# Patient Record
Sex: Female | Born: 1985 | Race: Black or African American | Hispanic: No | Marital: Single | Smoking: Former smoker
Health system: Southern US, Community
[De-identification: ages and names within clinical notes are randomized; demographics above are authoritative.]

## PROBLEM LIST (undated history)

## (undated) DIAGNOSIS — R51 Headache: Secondary | ICD-10-CM

## (undated) DIAGNOSIS — L309 Dermatitis, unspecified: Secondary | ICD-10-CM

## (undated) DIAGNOSIS — D649 Anemia, unspecified: Secondary | ICD-10-CM

## (undated) DIAGNOSIS — I10 Essential (primary) hypertension: Secondary | ICD-10-CM

## (undated) HISTORY — PX: NO PAST SURGERIES: SHX2092

## (undated) HISTORY — DX: Dermatitis, unspecified: L30.9

## (undated) HISTORY — PX: TOOTH EXTRACTION: SUR596

---

## 2009-05-11 ENCOUNTER — Emergency Department (HOSPITAL_COMMUNITY): Admission: EM | Admit: 2009-05-11 | Discharge: 2009-05-11 | Payer: Self-pay | Admitting: Family Medicine

## 2009-05-27 ENCOUNTER — Emergency Department (HOSPITAL_COMMUNITY): Admission: EM | Admit: 2009-05-27 | Discharge: 2009-05-27 | Payer: Self-pay | Admitting: Emergency Medicine

## 2009-08-09 ENCOUNTER — Ambulatory Visit: Payer: Self-pay | Admitting: Obstetrics

## 2009-09-01 ENCOUNTER — Encounter: Payer: Self-pay | Admitting: Obstetrics and Gynecology

## 2009-09-19 ENCOUNTER — Encounter: Payer: Self-pay | Admitting: Obstetrics and Gynecology

## 2009-10-12 ENCOUNTER — Ambulatory Visit: Payer: Self-pay | Admitting: Obstetrics and Gynecology

## 2009-10-24 ENCOUNTER — Encounter: Payer: Self-pay | Admitting: Maternal & Fetal Medicine

## 2009-11-02 ENCOUNTER — Encounter: Payer: Self-pay | Admitting: Family

## 2009-11-02 ENCOUNTER — Ambulatory Visit: Payer: Self-pay | Admitting: Obstetrics and Gynecology

## 2009-11-02 LAB — CONVERTED CEMR LAB
ALT: <8 units/L (ref 0–35)
AST: 16 units/L (ref 0–37)
Albumin: 3.3 g/dL — ABNORMAL LOW (ref 3.5–5.2)
Alkaline Phosphatase: 109 units/L (ref 39–117)
Antibody Screen: NEGATIVE
BUN: 9 mg/dL (ref 6–23)
CO2: 20 meq/L (ref 19–32)
Calcium: 8.5 mg/dL (ref 8.4–10.5)
Chloride: 105 meq/L (ref 96–112)
Creatinine, Ser: 0.61 mg/dL (ref 0.40–1.20)
Glucose, Bld: 48 mg/dL — ABNORMAL LOW (ref 70–99)
HCT: 28.7 % — ABNORMAL LOW (ref 36.0–46.0)
Hemoglobin: 9.8 g/dL — ABNORMAL LOW (ref 12.0–15.0)
MCHC: 34.1 g/dL (ref 30.0–36.0)
MCV: 77.2 fL — ABNORMAL LOW (ref 78.0–100.0)
Platelets: 153 10*3/uL (ref 150–400)
Potassium: 3.7 meq/L (ref 3.5–5.3)
RBC: 3.72 M/uL — ABNORMAL LOW (ref 3.87–5.11)
RDW: 14.6 % (ref 11.5–15.5)
Sodium: 140 meq/L (ref 135–145)
Total Bilirubin: 0.9 mg/dL (ref 0.3–1.2)
Total Protein: 6.3 g/dL (ref 6.0–8.3)
Uric Acid, Serum: 3.9 mg/dL (ref 2.4–7.0)
WBC: 7.9 10*3/uL (ref 4.0–10.5)

## 2009-11-04 ENCOUNTER — Encounter: Payer: Self-pay | Admitting: Obstetrics & Gynecology

## 2009-11-04 LAB — CONVERTED CEMR LAB
Collection Interval-CRCL: 24 hr
Creatinine 24 HR UR: 705 mg/24hr (ref 700–1800)
Creatinine Clearance: 80 mL/min (ref 75–115)
Creatinine, Urine: 88.1 mg/dL
Protein, Ur: 40 mg/24hr — ABNORMAL LOW (ref 50–100)

## 2009-11-16 ENCOUNTER — Ambulatory Visit: Payer: Self-pay | Admitting: Obstetrics and Gynecology

## 2009-11-16 LAB — CONVERTED CEMR LAB
Ferritin: 9 ng/mL — ABNORMAL LOW (ref 10–291)
Folate: 18.6 ng/mL
Hgb A2 Quant: 3.2 % (ref 2.2–3.2)
Hgb A: 60.9 % — ABNORMAL LOW (ref 96.8–97.8)
Hgb F Quant: 0.3 % (ref 0.0–2.0)
Hgb S Quant: 0 % (ref 0.0–0.0)
Vitamin B-12: 373 pg/mL (ref 211–911)

## 2009-11-24 ENCOUNTER — Encounter: Payer: Self-pay | Admitting: Maternal and Fetal Medicine

## 2009-12-01 ENCOUNTER — Encounter: Payer: Self-pay | Admitting: Obstetrics & Gynecology

## 2009-12-05 ENCOUNTER — Encounter: Payer: Self-pay | Admitting: Obstetrics and Gynecology

## 2009-12-05 ENCOUNTER — Ambulatory Visit: Payer: Self-pay | Admitting: Obstetrics & Gynecology

## 2009-12-05 LAB — CONVERTED CEMR LAB
Chlamydia, DNA Probe: NEGATIVE
GC Probe Amp, Genital: NEGATIVE

## 2009-12-08 ENCOUNTER — Ambulatory Visit: Payer: Self-pay | Admitting: Obstetrics and Gynecology

## 2009-12-08 ENCOUNTER — Ambulatory Visit (HOSPITAL_COMMUNITY): Admission: RE | Admit: 2009-12-08 | Discharge: 2009-12-08 | Payer: Self-pay | Admitting: Family Medicine

## 2009-12-12 ENCOUNTER — Ambulatory Visit: Payer: Self-pay | Admitting: Obstetrics & Gynecology

## 2009-12-15 ENCOUNTER — Ambulatory Visit: Payer: Self-pay | Admitting: Obstetrics & Gynecology

## 2009-12-15 ENCOUNTER — Encounter: Payer: Self-pay | Admitting: Obstetrics and Gynecology

## 2009-12-19 ENCOUNTER — Inpatient Hospital Stay (HOSPITAL_COMMUNITY): Admission: AD | Admit: 2009-12-19 | Discharge: 2009-12-21 | Payer: Self-pay | Admitting: Obstetrics & Gynecology

## 2009-12-19 ENCOUNTER — Ambulatory Visit: Payer: Self-pay | Admitting: Obstetrics and Gynecology

## 2009-12-19 ENCOUNTER — Encounter: Payer: Self-pay | Admitting: Obstetrics & Gynecology

## 2010-01-30 ENCOUNTER — Ambulatory Visit: Payer: Self-pay | Admitting: Obstetrics & Gynecology

## 2010-01-30 ENCOUNTER — Encounter: Payer: Self-pay | Admitting: Obstetrics and Gynecology

## 2010-01-30 LAB — CONVERTED CEMR LAB
Chlamydia, Swab/Urine, PCR: NEGATIVE
GC Probe Amp, Urine: NEGATIVE

## 2010-02-14 ENCOUNTER — Ambulatory Visit: Payer: Self-pay | Admitting: Family Medicine

## 2010-02-14 ENCOUNTER — Encounter: Payer: Self-pay | Admitting: Family Medicine

## 2010-02-14 DIAGNOSIS — I10 Essential (primary) hypertension: Secondary | ICD-10-CM | POA: Insufficient documentation

## 2010-02-14 LAB — CONVERTED CEMR LAB
Bilirubin Urine: NEGATIVE
Glucose, Urine, Semiquant: NEGATIVE
Nitrite: NEGATIVE
Protein, U semiquant: 30
Specific Gravity, Urine: 1.025
Urobilinogen, UA: 1
pH: 6

## 2010-02-16 ENCOUNTER — Encounter: Payer: Self-pay | Admitting: Family Medicine

## 2010-02-16 DIAGNOSIS — E079 Disorder of thyroid, unspecified: Secondary | ICD-10-CM | POA: Insufficient documentation

## 2010-02-16 LAB — CONVERTED CEMR LAB
ALT: 17 units/L (ref 0–35)
AST: 20 units/L (ref 0–37)
Albumin: 4.3 g/dL (ref 3.5–5.2)
Alkaline Phosphatase: 81 units/L (ref 39–117)
BUN: 11 mg/dL (ref 6–23)
CO2: 22 meq/L (ref 19–32)
Calcium: 9.1 mg/dL (ref 8.4–10.5)
Chloride: 109 meq/L (ref 96–112)
Cholesterol: 162 mg/dL (ref 0–200)
Creatinine, Ser: 0.96 mg/dL (ref 0.40–1.20)
Free T4: 1.06 ng/dL (ref 0.80–1.80)
Glucose, Bld: 96 mg/dL (ref 70–99)
HCT: 37.9 % (ref 36.0–46.0)
HDL: 53 mg/dL (ref >39)
Hemoglobin: 13.1 g/dL (ref 12.0–15.0)
LDL Cholesterol: 99 mg/dL (ref 0–99)
MCHC: 34.6 g/dL (ref 30.0–36.0)
MCV: 75.2 fL — ABNORMAL LOW (ref 78.0–100.0)
Platelets: 245 10*3/uL (ref 150–400)
Potassium: 4 meq/L (ref 3.5–5.3)
RBC: 5.04 M/uL (ref 3.87–5.11)
RDW: 16.8 % — ABNORMAL HIGH (ref 11.5–15.5)
Sodium: 140 meq/L (ref 135–145)
T3, Free: 3 pg/mL (ref 2.3–4.2)
TSH: 0.265 microintl units/mL — ABNORMAL LOW (ref 0.350–4.500)
Total Bilirubin: 1.7 mg/dL — ABNORMAL HIGH (ref 0.3–1.2)
Total CHOL/HDL Ratio: 3.1
Total Protein: 7.3 g/dL (ref 6.0–8.3)
Triglycerides: 48 mg/dL (ref <150)
VLDL: 10 mg/dL (ref 0–40)
WBC: 4.8 10*3/uL (ref 4.0–10.5)

## 2010-05-12 ENCOUNTER — Emergency Department (HOSPITAL_COMMUNITY)
Admission: EM | Admit: 2010-05-12 | Discharge: 2010-05-12 | Payer: Self-pay | Source: Home / Self Care | Admitting: Emergency Medicine

## 2010-05-22 LAB — POCT RAPID STREP A (OFFICE): Streptococcus, Group A Screen (Direct): NEGATIVE

## 2010-06-06 NOTE — Assessment & Plan Note (Signed)
Summary: NP/Ref from [facility]   Vital Signs:  Patient profile:   25 year old female Height:      63.5 inches Weight:      134.2 pounds BMI:     23.48 Temp:     98.9 degrees F oral Pulse rate:   103 / minute BP sitting:   131 / 81  (left arm) Cuff size:   regular  Vitals Entered By: Garen Grams LPN (February 14, 2010 10:16 AM) CC: New Patient (f/u HTN) Is Patient Diabetic? No Pain Assessment Patient in pain? no        Primary Care Provider:  Bobby Rumpf  MD  CC:  New Patient (f/u HTN).  History of Present Illness: 1) HTN: New patient referred from [facility] for control of hypertension. Does not check BP at home. HTN was discovered while she was in the [employer] - patient was started in HCTZ - she cannot remember if this worked or not. BP elevated with fast food, salty foods. No history of pre-ecclampsia during pregnancies (G2P2). On methyldopa as below currently, with plan to start Norvasc 10 mg when methyldopa runs out. No exercise currently. Does not monitor salt intake. Two servings fruits / vegetables a day.   Not currently breastfeeding  ROS: Denies chest pain, headache, LE edema, dyspnea, focal neurological signs, vision change, abdominal pain, hyperthyroid sympotms, urinary changes, heart palpitations  Med Rec: Methydopa 500 mg by mouth two times a day   Habits & Providers  Alcohol-Tobacco-Diet     Tobacco Status: never  Allergies (verified): No Known Drug Allergies  Past History:  Past Medical History: HTN Seasonal allergies   G2P2  Past Surgical History: None  Family History: Dad - DM2, HTN Mom - thyroid disease   Social History: Two children, fiance.  no alcohol or tobacco or drug use. In [employer]. Smoking Status:  never  Physical Exam  General:  vitals reviewed, normotensive pleasant, NAD  Eyes:  normal fundi  Mouth:  moist membranes  Neck:  no thyromegaly or carotid bruit or JVD  Lungs:  CTAB w/o crackles  Heart:  RRR, no murmurs Abdomen:   soft non tender, non distended, no bruits  Pulses:  2+ radials  Extremities:  no edema   Impression & Recommendations:  Problem # 1:  HYPERTENSION (ICD-401.9) Assessment New Will initiate work up with labs as below. Will start with HCTZ once patient has completed methyldopa. Follow up in one month. DASH diet and exercise discussed. At goal today 140/90.  Her updated medication list for this problem includes:    Hydrochlorothiazide 25 Mg Tabs (Hydrochlorothiazide) ..... One tab by mouth qday  Orders: Comp Met-[facility] [phone]) Lipid-[facility] ([id number]) CBC-[facility] (41660) TSH-[facility] ([id number]) Urinalysis-[facility] (00000)  Complete Medication List: 1)  Hydrochlorothiazide 25 Mg Tabs (Hydrochlorothiazide) .... One tab by mouth qday  Patient Instructions: 1)  Follow up with me in one month 2)  It was great to meet you today! 3)  Avoid adding salt you your food (especially when you cook) 4)  I will start you on hydrochlorothiazide to take after you are done with the methyldopa. Prescriptions: HYDROCHLOROTHIAZIDE 25 MG TABS (HYDROCHLOROTHIAZIDE) one tab by mouth qday  #30 x 0   Entered and Authorized by:   Bobby Rumpf  MD   Signed by:   Bobby Rumpf  MD on 02/14/2010   Method used:   Electronically to        [facility]  [phone]* (retail)       [phone] [address]  [address]       [place], [place]  [address]       Ph: [phone] or [phone]       Fax: [phone]   RxID:   [id number]   Laboratory Results   Urine Tests  Date/Time Received: February 14, 2010 10:53 AM  Date/Time Reported: February 14, 2010 11:50 AM   Routine Urinalysis   Color: red Appearance: Cloudy Glucose: negative   (Normal Range: Negative) Bilirubin: negative   (Normal Range: Negative) Ketone: trace (5)   (Normal Range: Negative) Spec. Gravity: 1.025   (Normal Range: 1.003-1.035) Blood: large   (Normal Range: Negative) pH: 6.0   (Normal Range: 5.0-8.0) Protein: 30   (Normal  Range: Negative) Urobilinogen: 1.0   (Normal Range: 0-1) Nitrite: negative   (Normal Range: Negative) Leukocyte Esterace: trace   (Normal Range: Negative)  Urine Microscopic WBC/HPF: 10-20 RBC/HPF: loaded Bacteria/HPF: 3+ cocci Epithelial/HPF: 10-20 with several clue cells    Comments: ...............test performed by......Marland KitchenBonnie A. [place], MLS (ASCP)cm

## 2010-07-20 LAB — POCT URINALYSIS DIPSTICK
Bilirubin Urine: NEGATIVE
Glucose, UA: NEGATIVE mg/dL
Ketones, ur: NEGATIVE mg/dL
Nitrite: NEGATIVE
Protein, ur: NEGATIVE mg/dL
Specific Gravity, Urine: 1.02 (ref 1.005–1.030)
Urobilinogen, UA: 0.2 mg/dL (ref 0.0–1.0)
pH: 5.5 (ref 5.0–8.0)

## 2010-07-21 LAB — CBC
HCT: 27 % — ABNORMAL LOW (ref 36.0–46.0)
HCT: 28.8 % — ABNORMAL LOW (ref 36.0–46.0)
Hemoglobin: 9.3 g/dL — ABNORMAL LOW (ref 12.0–15.0)
Hemoglobin: 9.6 g/dL — ABNORMAL LOW (ref 12.0–15.0)
MCH: 26.7 pg (ref 26.0–34.0)
MCH: 27.4 pg (ref 26.0–34.0)
MCHC: 33.4 g/dL (ref 30.0–36.0)
MCHC: 34.3 g/dL (ref 30.0–36.0)
MCV: 79.9 fL (ref 78.0–100.0)
MCV: 80 fL (ref 78.0–100.0)
Platelets: 127 10*3/uL — ABNORMAL LOW (ref 150–400)
Platelets: 147 10*3/uL — ABNORMAL LOW (ref 150–400)
RBC: 3.37 MIL/uL — ABNORMAL LOW (ref 3.87–5.11)
RBC: 3.61 MIL/uL — ABNORMAL LOW (ref 3.87–5.11)
RDW: 15.4 % (ref 11.5–15.5)
RDW: 15.8 % — ABNORMAL HIGH (ref 11.5–15.5)
WBC: 11.5 10*3/uL — ABNORMAL HIGH (ref 4.0–10.5)
WBC: 5.9 10*3/uL (ref 4.0–10.5)

## 2010-07-21 LAB — RPR: RPR Ser Ql: NONREACTIVE

## 2010-07-21 LAB — POCT URINALYSIS DIPSTICK
Bilirubin Urine: NEGATIVE
Bilirubin Urine: NEGATIVE
Glucose, UA: NEGATIVE mg/dL
Glucose, UA: NEGATIVE mg/dL
Hgb urine dipstick: NEGATIVE
Hgb urine dipstick: NEGATIVE
Ketones, ur: NEGATIVE mg/dL
Ketones, ur: NEGATIVE mg/dL
Nitrite: NEGATIVE
Nitrite: NEGATIVE
Protein, ur: NEGATIVE mg/dL
Protein, ur: NEGATIVE mg/dL
Specific Gravity, Urine: 1.015 (ref 1.005–1.030)
Specific Gravity, Urine: 1.025 (ref 1.005–1.030)
Urobilinogen, UA: 0.2 mg/dL (ref 0.0–1.0)
Urobilinogen, UA: 1 mg/dL (ref 0.0–1.0)
pH: 6 (ref 5.0–8.0)
pH: 6.5 (ref 5.0–8.0)

## 2010-07-21 LAB — RH IMMUNE GLOB WKUP(>/=20WKS)(NOT WOMEN'S HOSP): Fetal Screen: NEGATIVE

## 2010-07-21 LAB — GLUCOSE, CAPILLARY: Glucose-Capillary: 92 mg/dL (ref 70–99)

## 2010-07-21 LAB — ABO/RH: ABO/RH(D): A NEG

## 2010-07-21 LAB — PLATELET COUNT: Platelets: 163 10*3/uL (ref 150–400)

## 2010-07-23 LAB — POCT URINALYSIS DIP (DEVICE)
Bilirubin Urine: NEGATIVE
Glucose, UA: NEGATIVE mg/dL
Glucose, UA: NEGATIVE mg/dL
Glucose, UA: NEGATIVE mg/dL
Hgb urine dipstick: NEGATIVE
Hgb urine dipstick: NEGATIVE
Hgb urine dipstick: NEGATIVE
Ketones, ur: 15 mg/dL — AB
Ketones, ur: 80 mg/dL — AB
Ketones, ur: NEGATIVE mg/dL
Nitrite: NEGATIVE
Nitrite: NEGATIVE
Nitrite: NEGATIVE
Protein, ur: 30 mg/dL — AB
Protein, ur: NEGATIVE mg/dL
Protein, ur: 30 mg/dL — AB
Specific Gravity, Urine: 1.025 (ref 1.005–1.030)
Specific Gravity, Urine: 1.025 (ref 1.005–1.030)
Specific Gravity, Urine: 1.025 (ref 1.005–1.030)
Urobilinogen, UA: 1 mg/dL (ref 0.0–1.0)
Urobilinogen, UA: 0.2 mg/dL (ref 0.0–1.0)
Urobilinogen, UA: 1 mg/dL (ref 0.0–1.0)
pH: 6 (ref 5.0–8.0)
pH: 6 (ref 5.0–8.0)
pH: 7 (ref 5.0–8.0)

## 2010-07-23 LAB — URINE CULTURE

## 2010-07-23 LAB — POCT PREGNANCY, URINE: Preg Test, Ur: POSITIVE

## 2010-07-24 LAB — URINE CULTURE: Colony Count: 2000

## 2010-07-24 LAB — POCT URINALYSIS DIP (DEVICE)
Bilirubin Urine: NEGATIVE
Bilirubin Urine: NEGATIVE
Glucose, UA: NEGATIVE mg/dL
Glucose, UA: NEGATIVE mg/dL
Hgb urine dipstick: NEGATIVE
Ketones, ur: NEGATIVE mg/dL
Ketones, ur: NEGATIVE mg/dL
Nitrite: NEGATIVE
Nitrite: NEGATIVE
Protein, ur: NEGATIVE mg/dL
Specific Gravity, Urine: <=1.005 (ref 1.005–1.030)
Urobilinogen, UA: 0.2 mg/dL (ref 0.0–1.0)
pH: 6 (ref 5.0–8.0)
pH: 7 (ref 5.0–8.0)

## 2010-07-24 LAB — WET PREP, GENITAL
Trich, Wet Prep: NONE SEEN
Yeast Wet Prep HPF POC: NONE SEEN

## 2010-07-24 LAB — POCT PREGNANCY, URINE: Preg Test, Ur: POSITIVE

## 2010-07-24 LAB — GC/CHLAMYDIA PROBE AMP, GENITAL
Chlamydia, DNA Probe: NEGATIVE
GC Probe Amp, Genital: NEGATIVE

## 2010-09-19 ENCOUNTER — Inpatient Hospital Stay (INDEPENDENT_AMBULATORY_CARE_PROVIDER_SITE_OTHER)
Admission: RE | Admit: 2010-09-19 | Discharge: 2010-09-19 | Disposition: A | Discharge status: Home / Self Care | Payer: Self-pay | Source: Ambulatory Visit | Attending: Family Medicine | Admitting: Family Medicine

## 2010-09-19 DIAGNOSIS — L259 Unspecified contact dermatitis, unspecified cause: Secondary | ICD-10-CM

## 2010-09-19 DIAGNOSIS — L989 Disorder of the skin and subcutaneous tissue, unspecified: Secondary | ICD-10-CM

## 2010-10-04 ENCOUNTER — Inpatient Hospital Stay (INDEPENDENT_AMBULATORY_CARE_PROVIDER_SITE_OTHER)
Admission: RE | Admit: 2010-10-04 | Discharge: 2010-10-04 | Disposition: A | Discharge status: Home / Self Care | Payer: Self-pay | Source: Ambulatory Visit | Attending: Emergency Medicine | Admitting: Emergency Medicine

## 2010-10-04 DIAGNOSIS — J069 Acute upper respiratory infection, unspecified: Secondary | ICD-10-CM

## 2010-10-04 DIAGNOSIS — J029 Acute pharyngitis, unspecified: Secondary | ICD-10-CM

## 2010-10-27 IMAGING — US US OB DETAIL+14 WK - NRPT [facility]
1 series · 14 of 28 positions shown · non-contrast
Comparison: none

[Series 1: us ob detail+14 wk - nrpt [facility] · 14 of 79 slices shown]
[im 3/79]
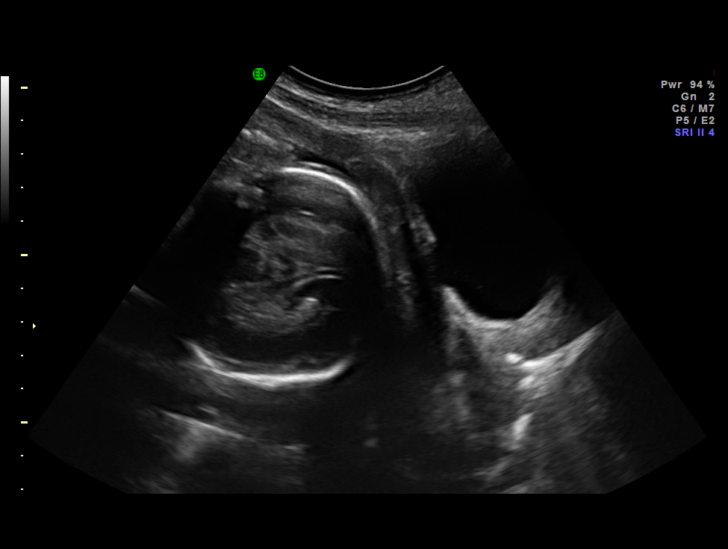
[im 9/79]
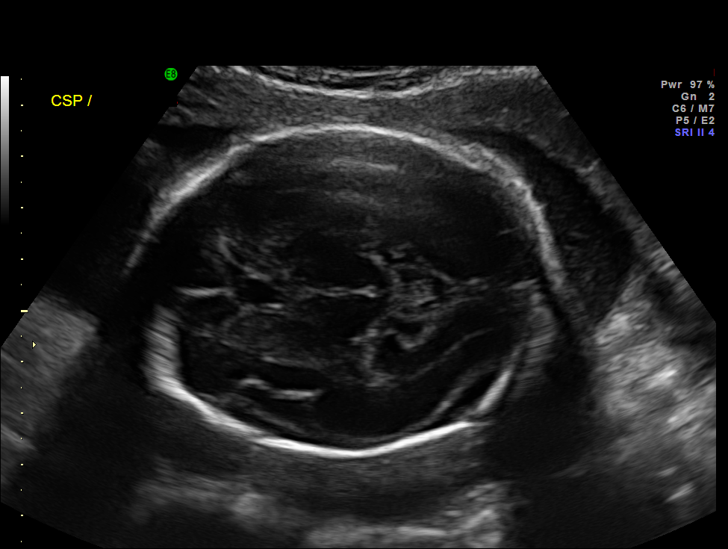
[im 15/79]
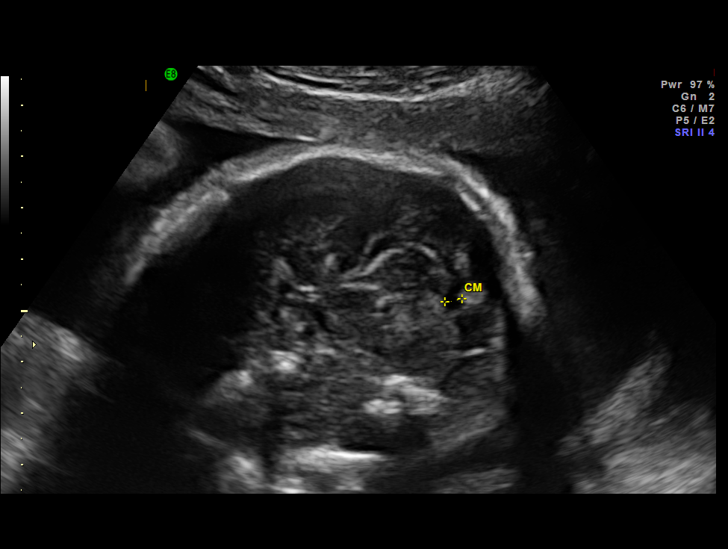
[im 21/79]
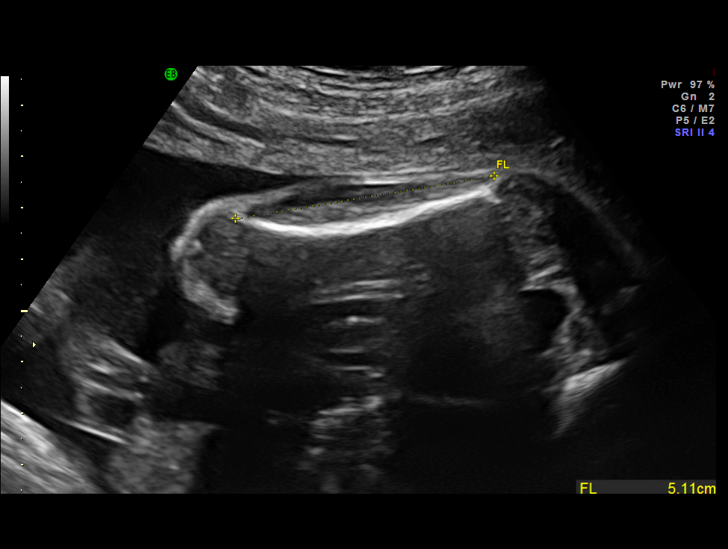
[im 27/79]
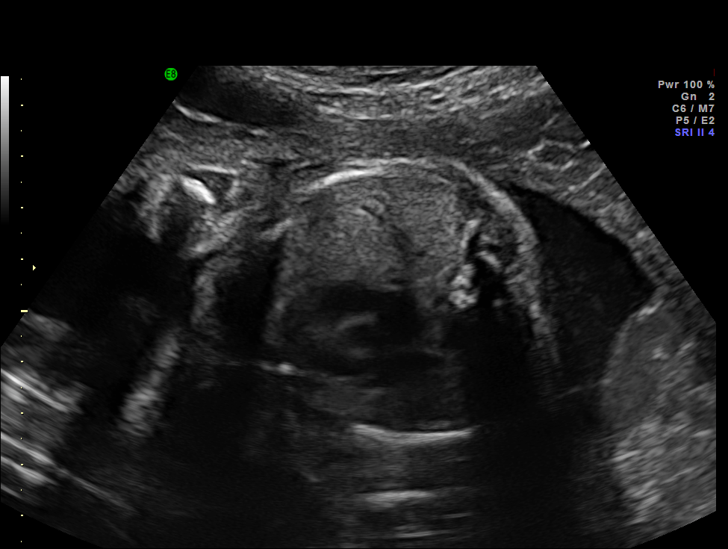
[im 32/79]
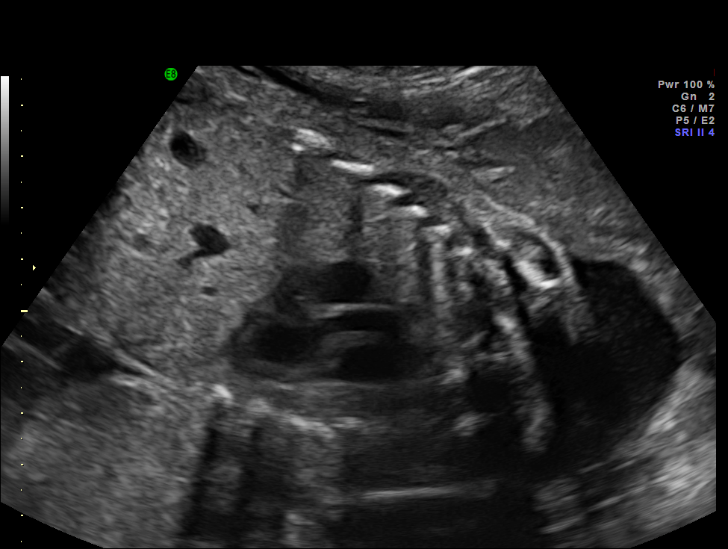
[im 38/79]
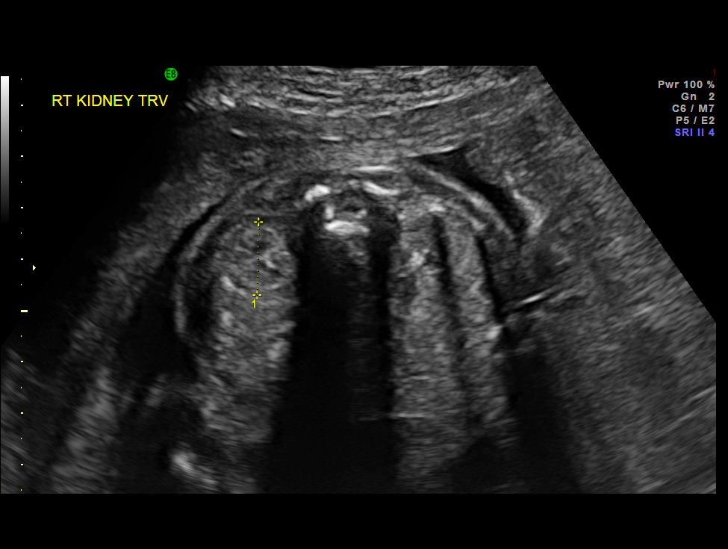
[im 44/79]
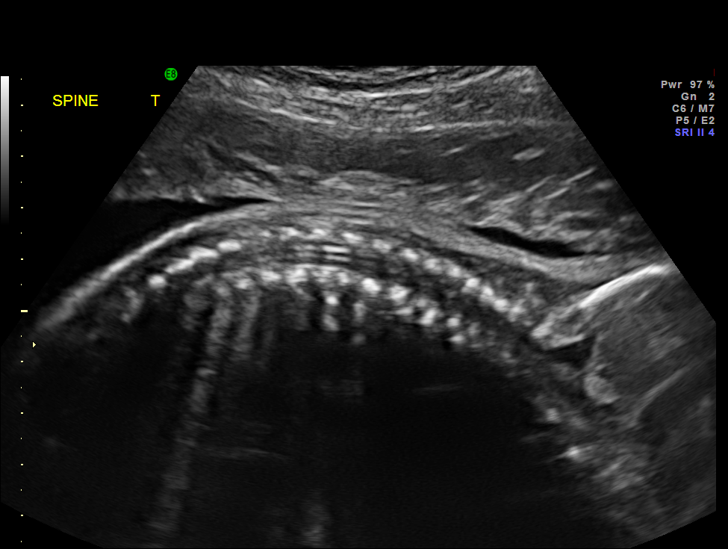
[im 50/79]
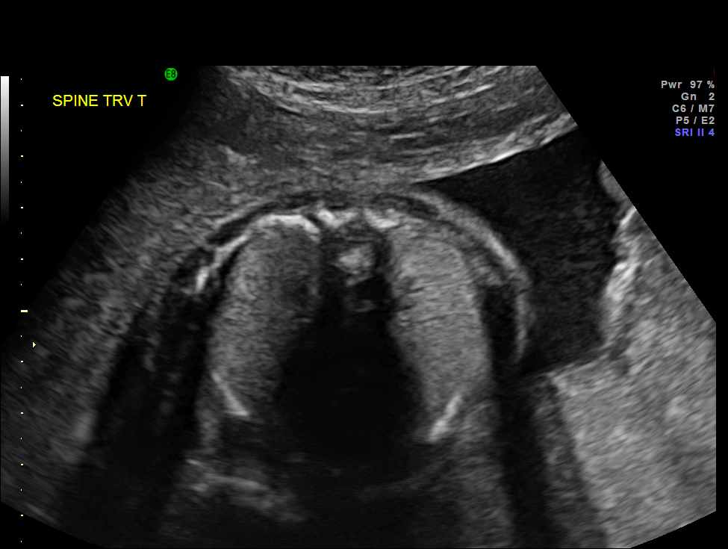
[im 55/79]
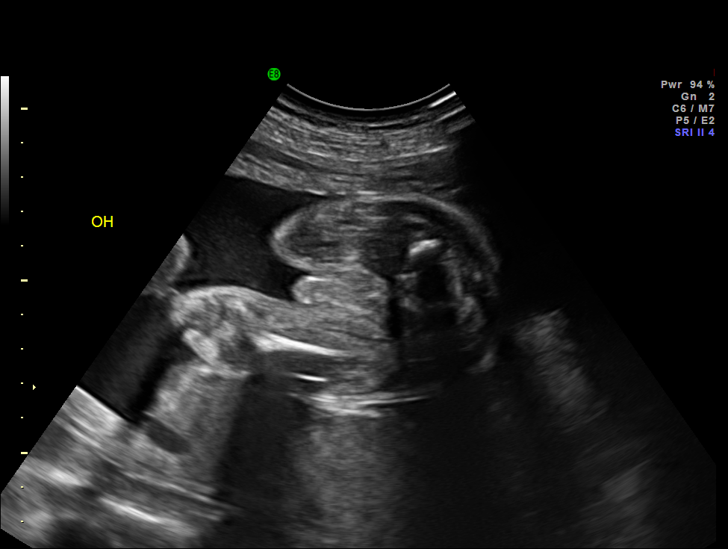
[im 61/79]
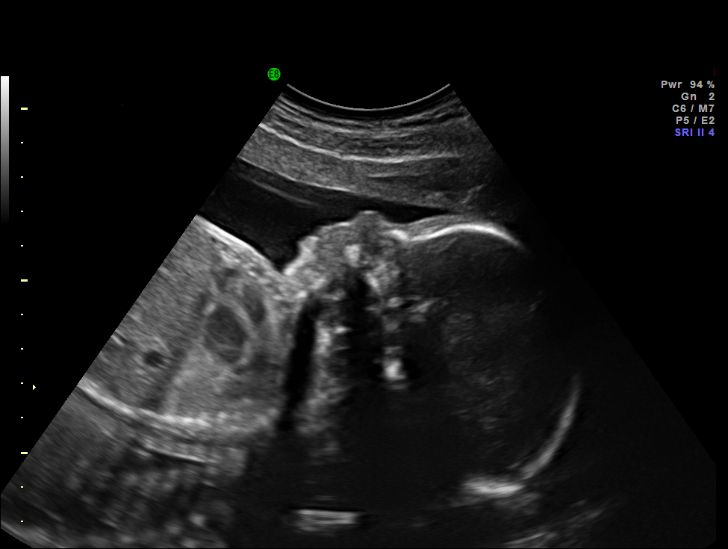
[im 67/79]
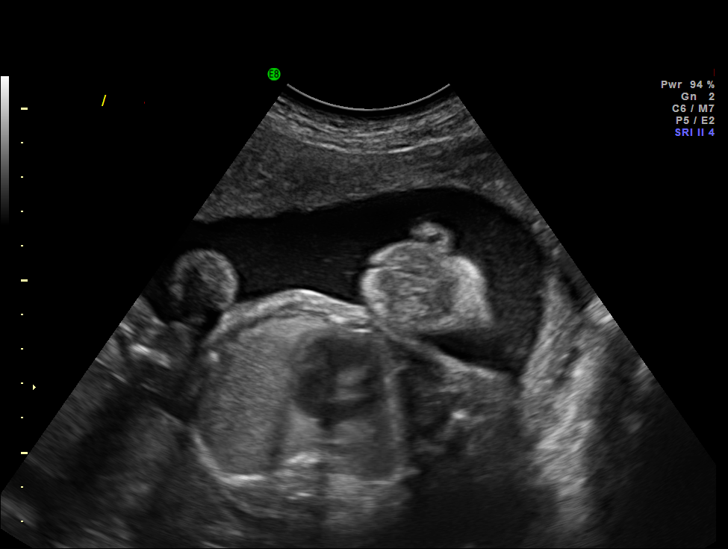
[im 73/79]
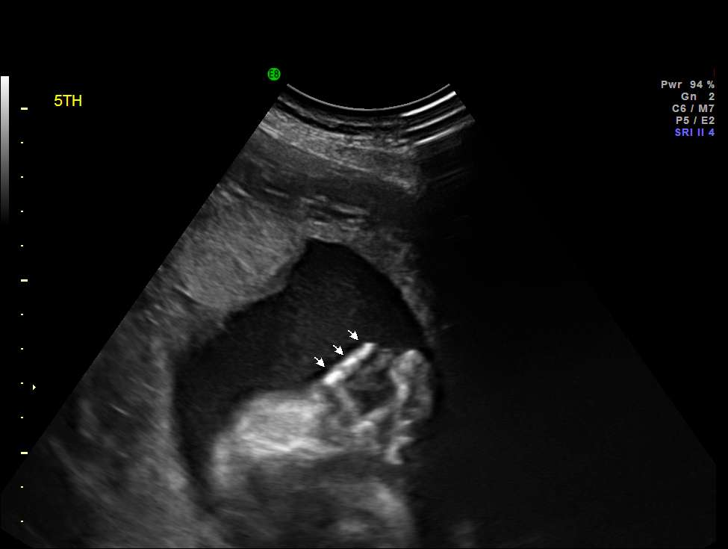
[im 79/79]
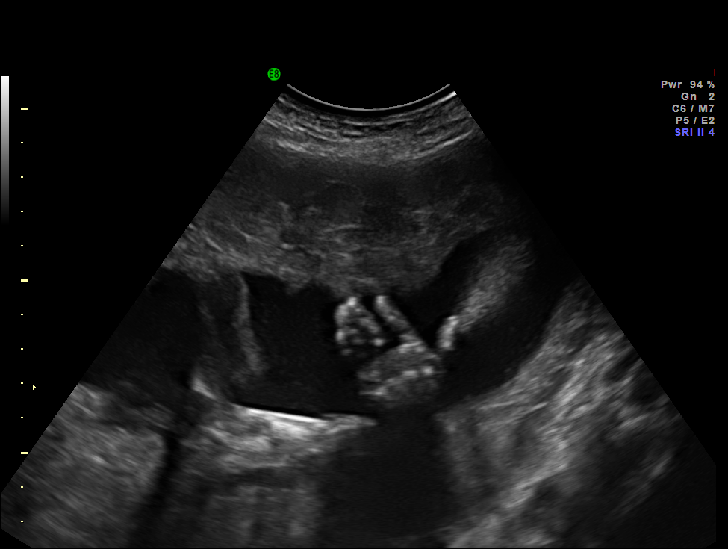

[14 of 28 positions shown; findings below may reference images not displayed]

IMAGES IMPORTED FROM THE SYNGO WORKFLOW SYSTEM
NO DICTATION FOR STUDY

## 2010-12-01 IMAGING — US US OB FOLLOW-UP - NRPT [facility]
1 series · 14 of 28 positions shown · non-contrast
Comparison: none

[Series 1: us ob follow-up - nrpt [facility] · 14 of 38 slices shown]
[im 2/38]
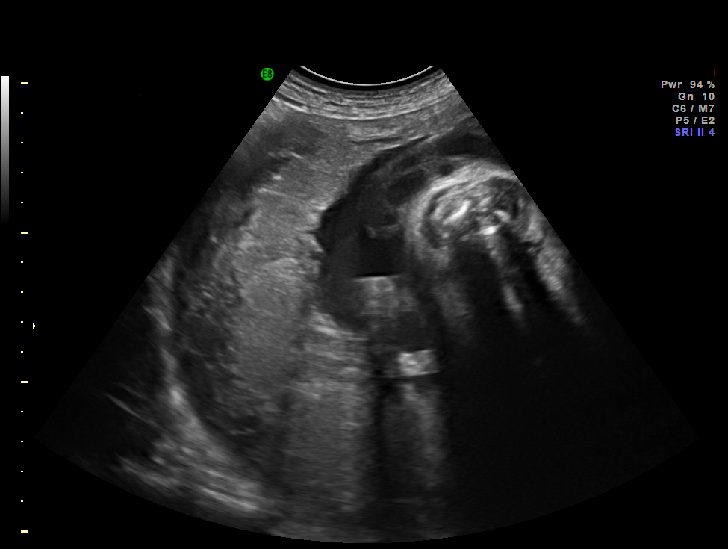
[im 5/38]
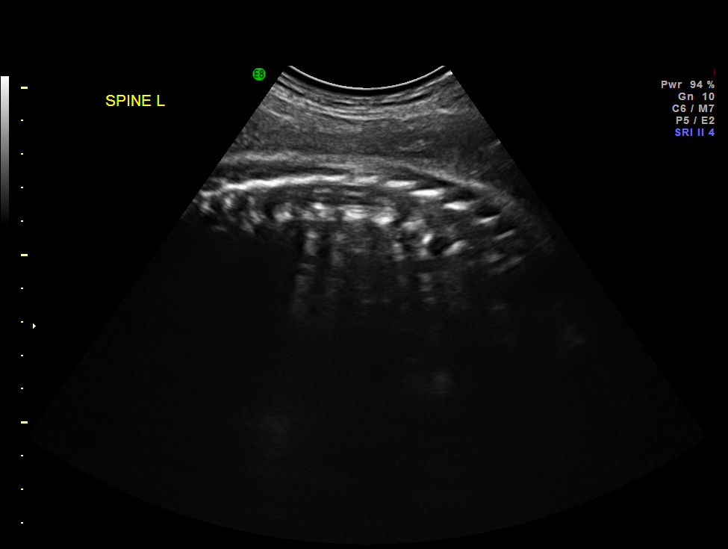
[im 7/38]
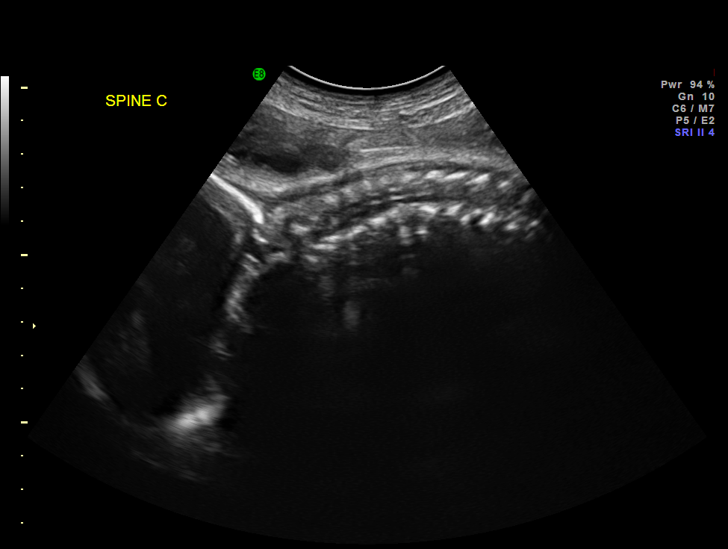
[im 10/38]
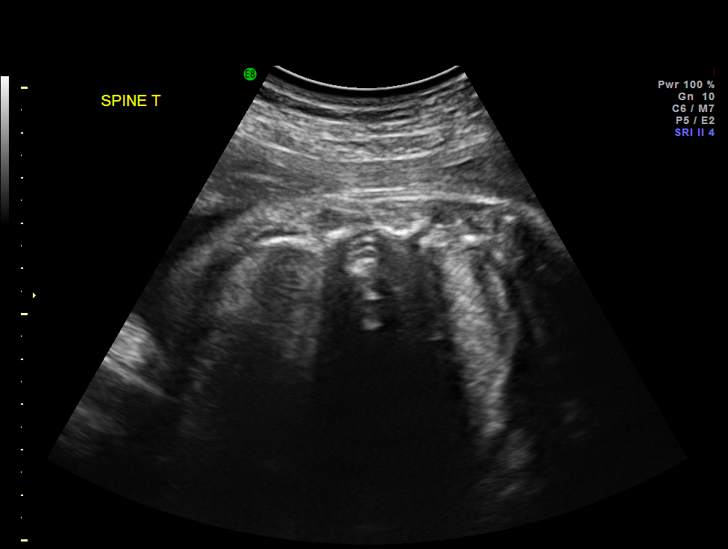
[im 13/38]
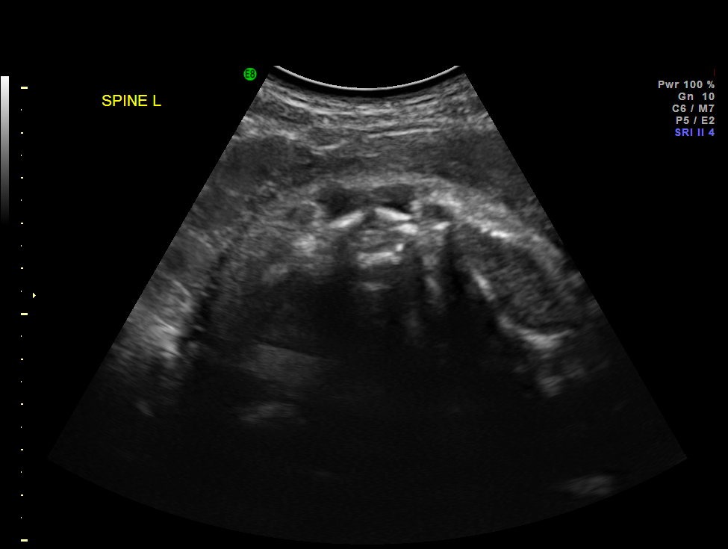
[im 16/38]
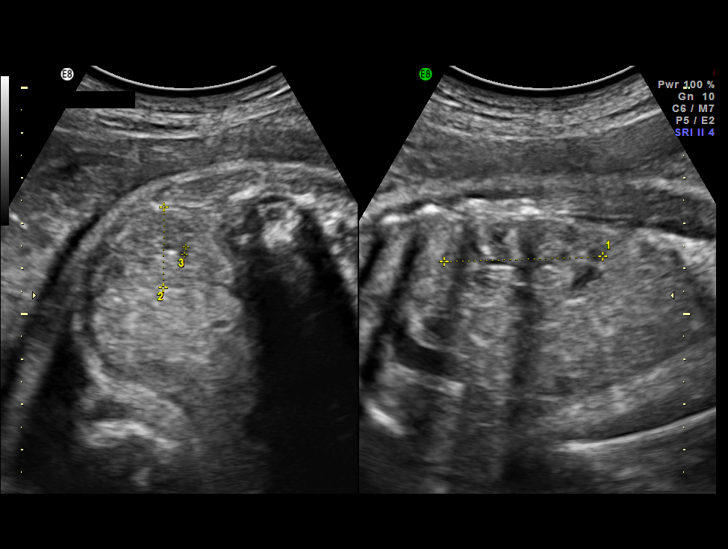
[im 18/38]
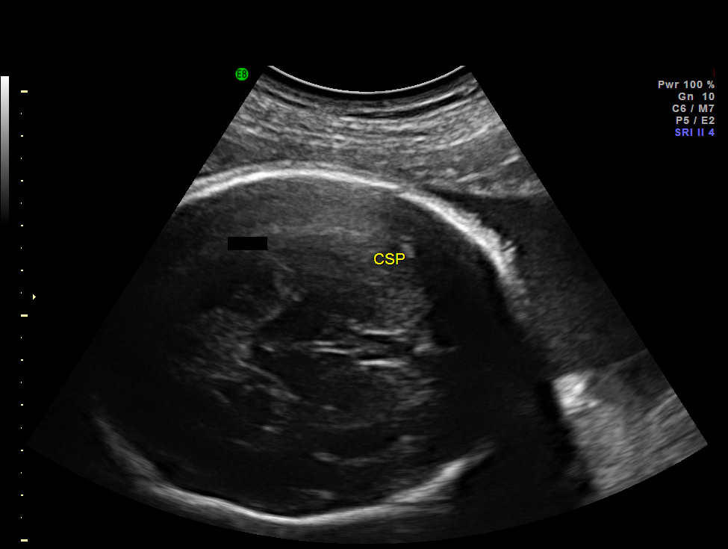
[im 21/38]
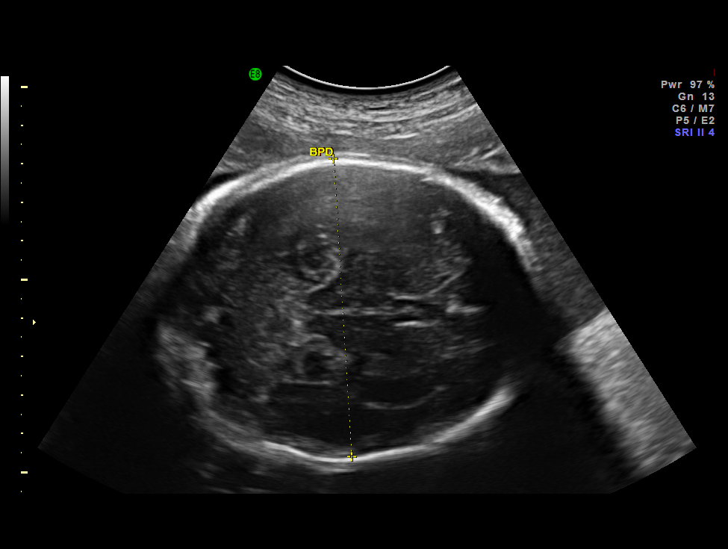
[im 24/38]
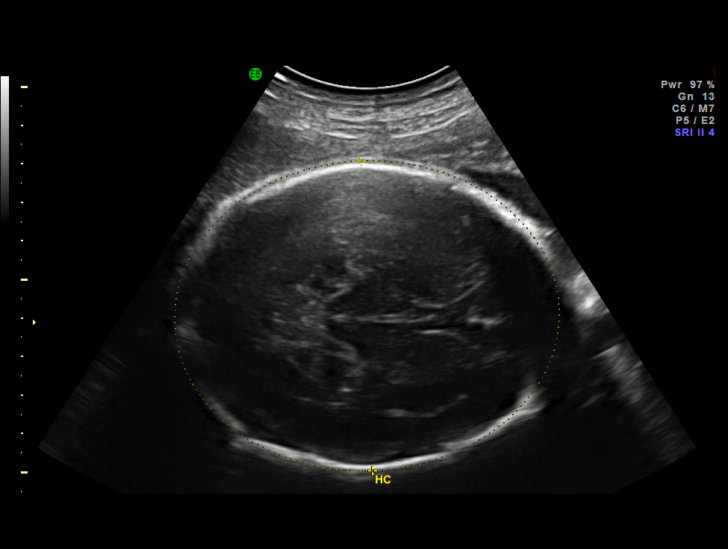
[im 27/38]
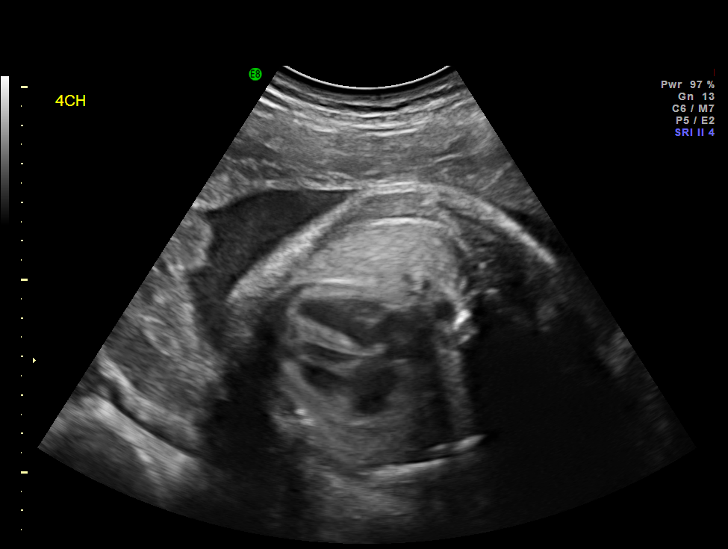
[im 29/38]
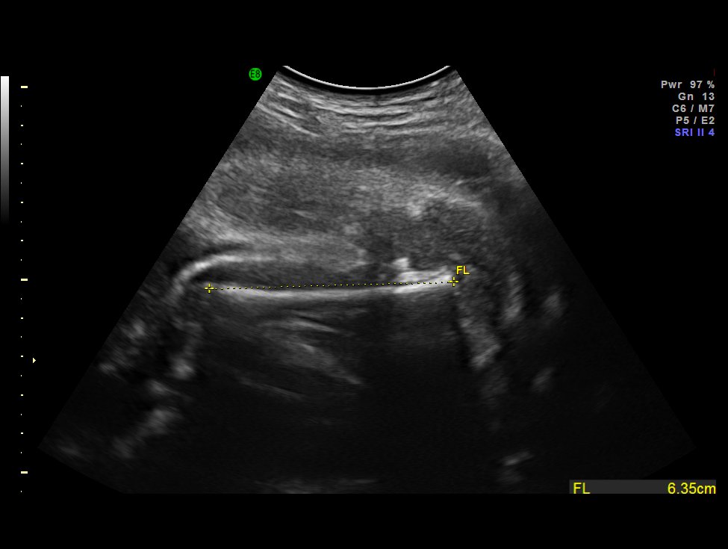
[im 32/38]
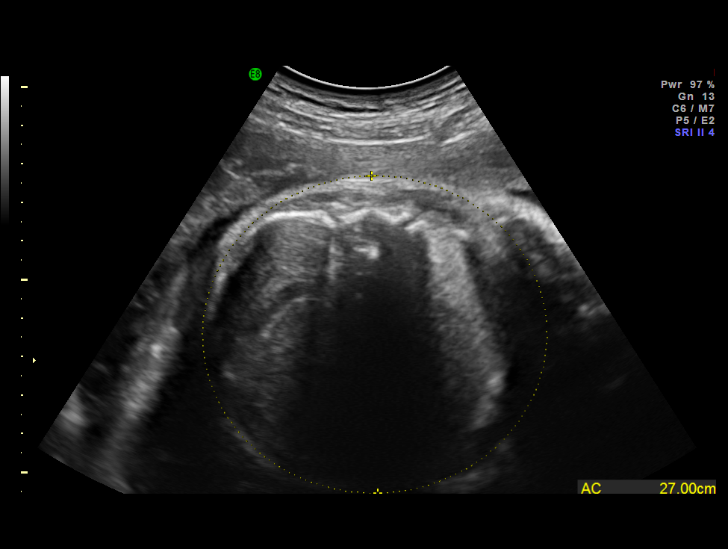
[im 35/38]
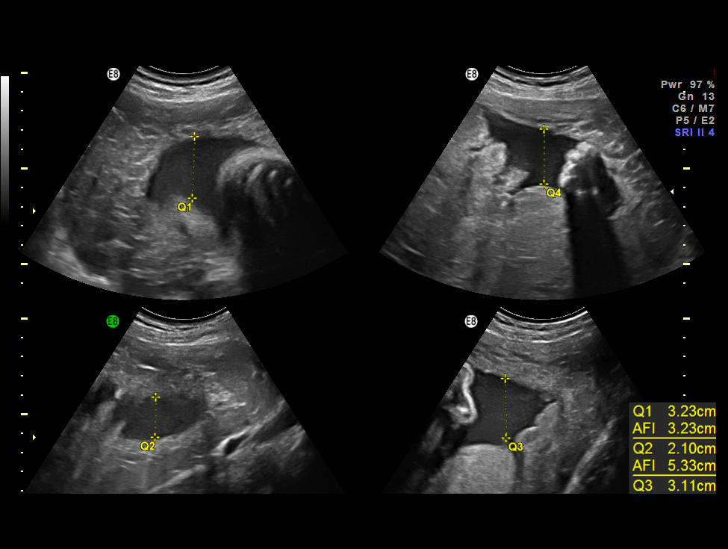
[im 38/38]
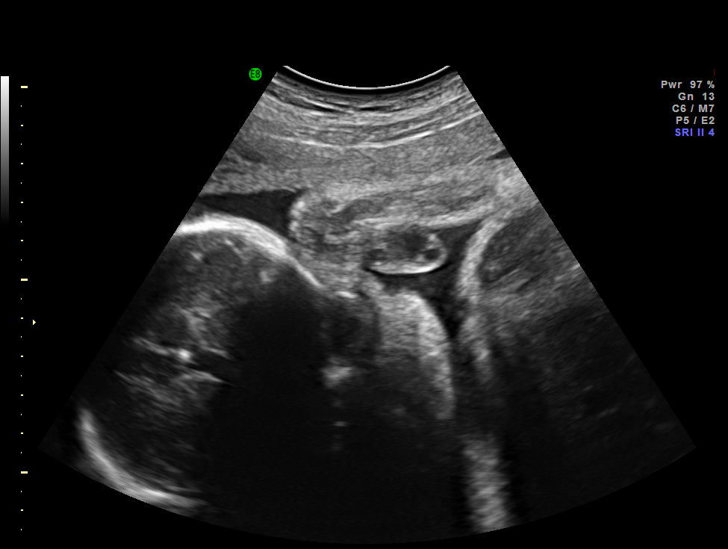

[14 of 28 positions shown; findings below may reference images not displayed]

IMAGES IMPORTED FROM THE SYNGO WORKFLOW SYSTEM
NO DICTATION FOR STUDY

## 2011-01-08 IMAGING — US US FETAL BPP W/O NON-STRESS - NRPT
1 series · 5 of 5 positions shown · non-contrast
Comparison: none

[Series 1: us fetal bpp w/o non-stress - nrpt · 5 of 5 slices shown]
[im 1/5]
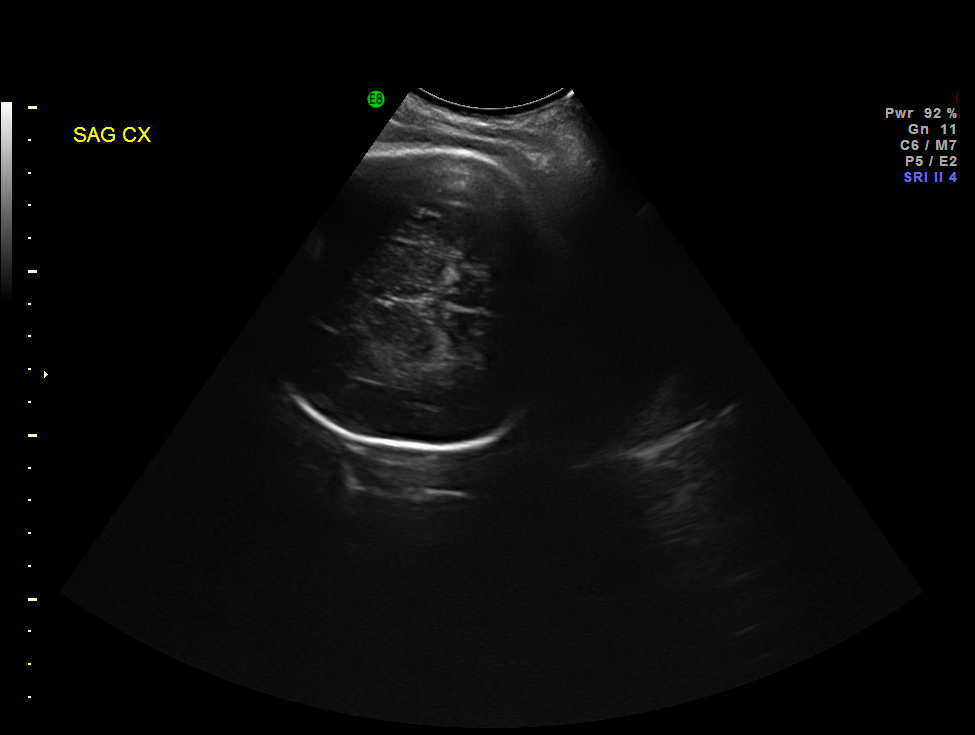
[im 2/5]
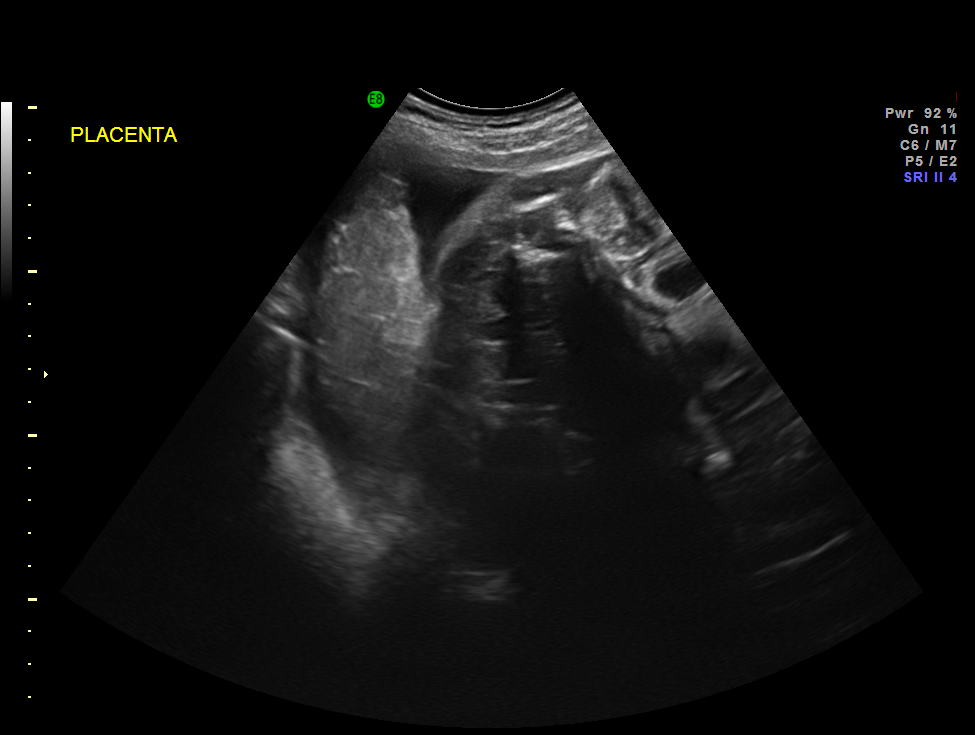
[im 3/5]
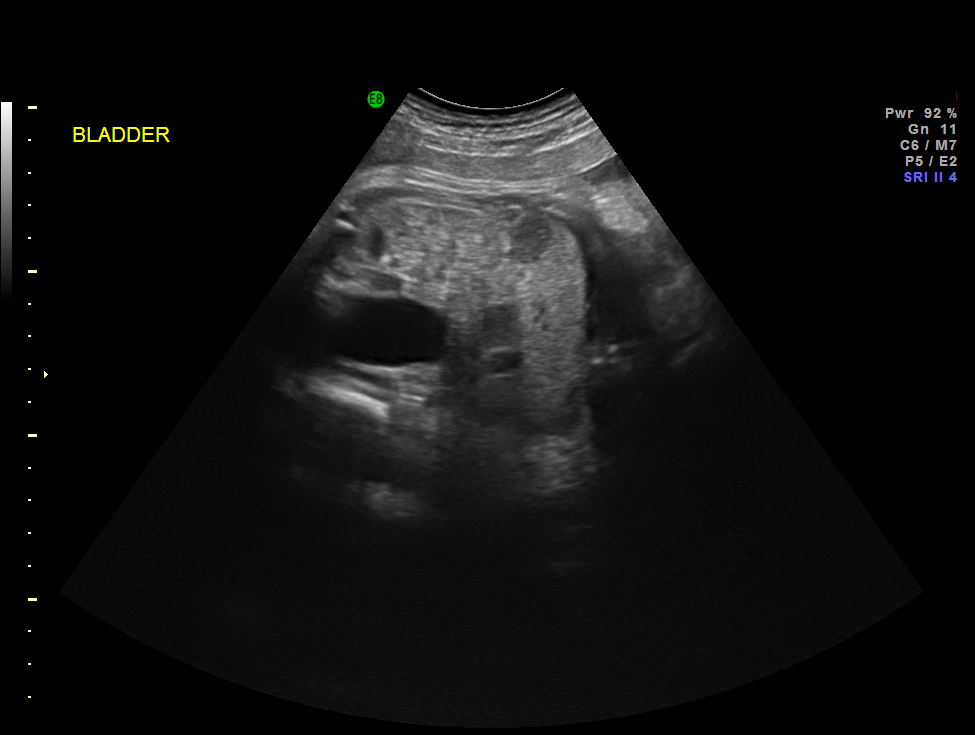
[im 4/5]
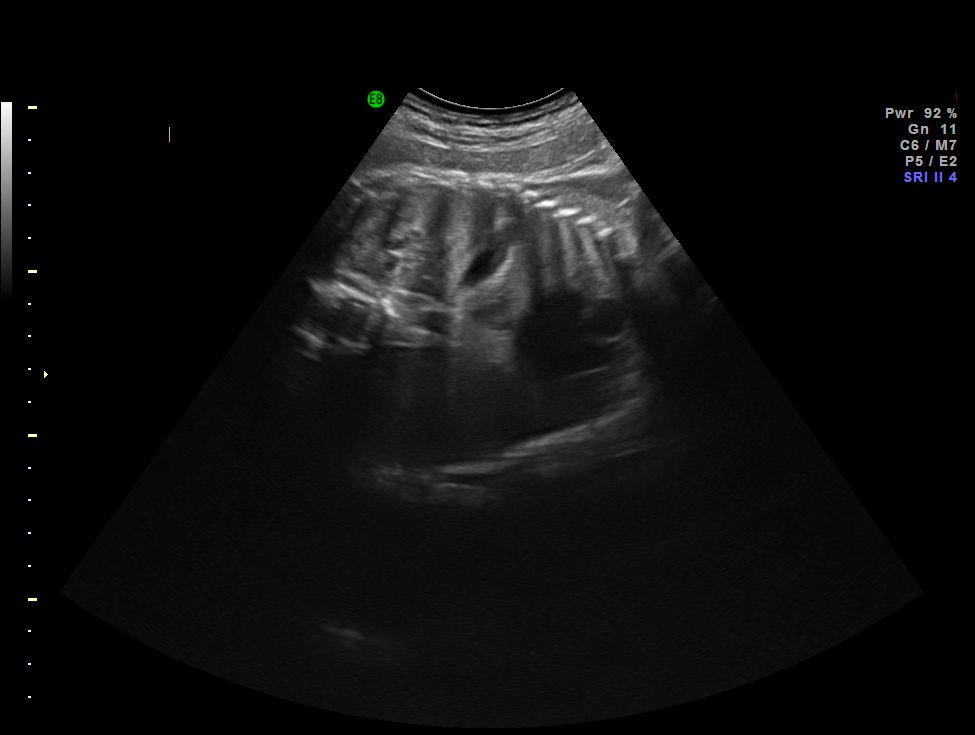
[im 5/5]
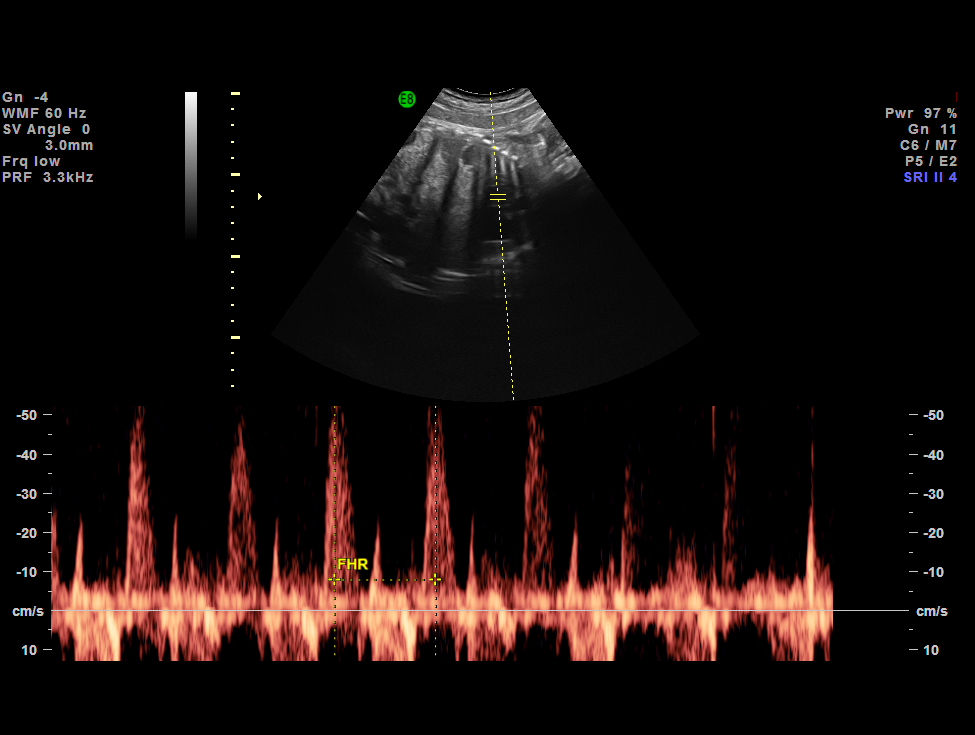

[5 of 5 positions shown; findings below may reference images not displayed]

IMAGES IMPORTED FROM THE SYNGO WORKFLOW SYSTEM
NO DICTATION FOR STUDY

## 2011-01-15 IMAGING — US US OB COMP +14 WK
1 series · 14 of 28 positions shown · non-contrast
Comparison: none

OBSTETRICAL ULTRASOUND:
 This ultrasound exam was performed in the [HOSPITAL] Ultrasound Department.  The OB US report was generated in the AS system, and faxed to the ordering physician.  This report is also available in [HOSPITAL]?s Access[employer] and in [REDACTED] PACS.

[Series 1: us ob comp +14 wk · 0.30mm/px · 14 of 45 slices shown]
[im 2/45]
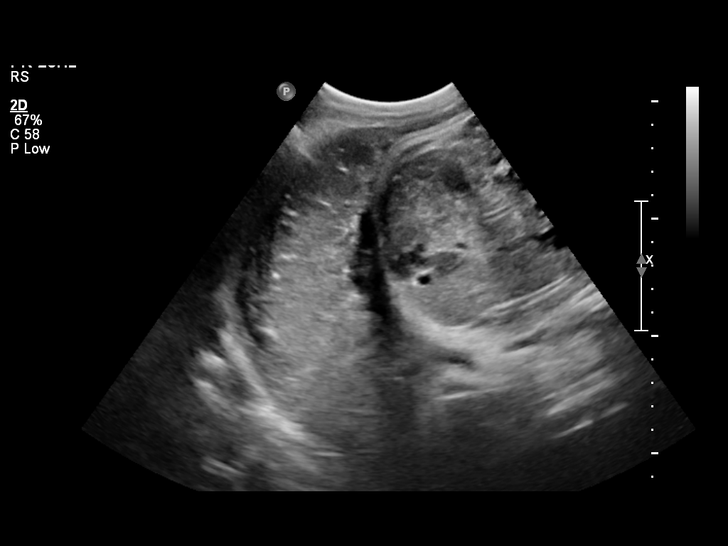
[im 5/45]
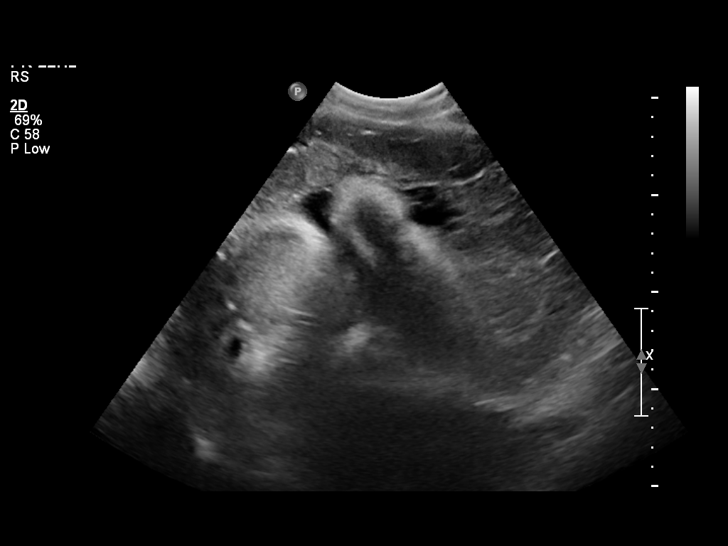
[im 9/45]
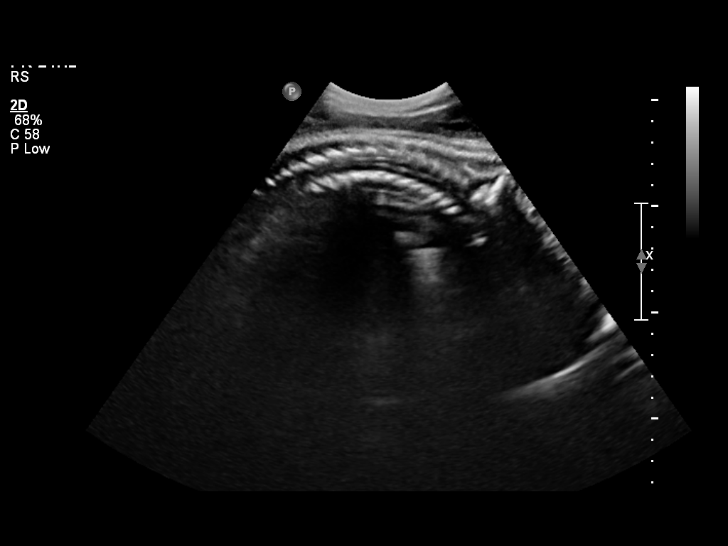
[im 12/45]
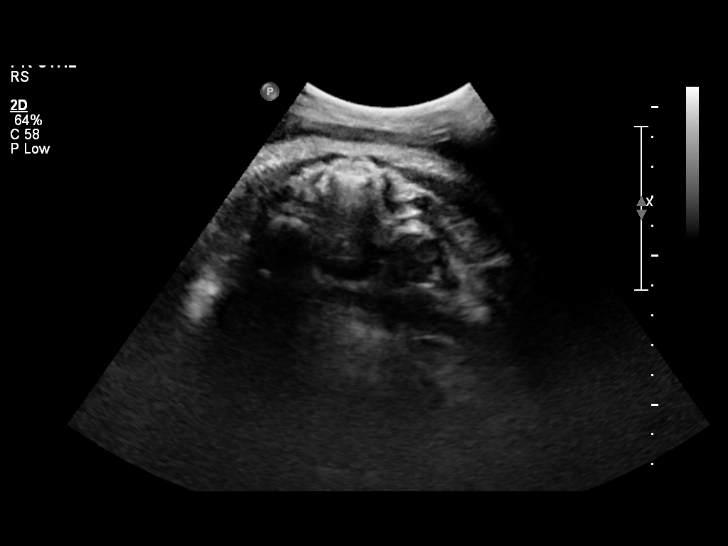
[im 15/45]
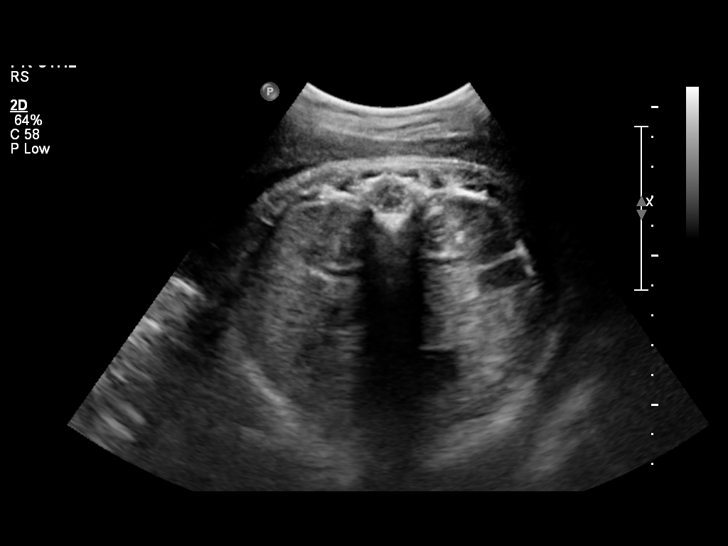
[im 18/45]
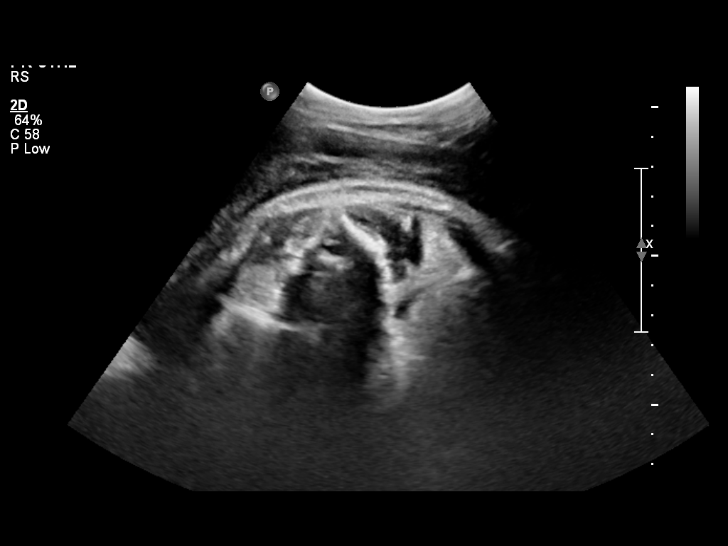
[im 22/45]
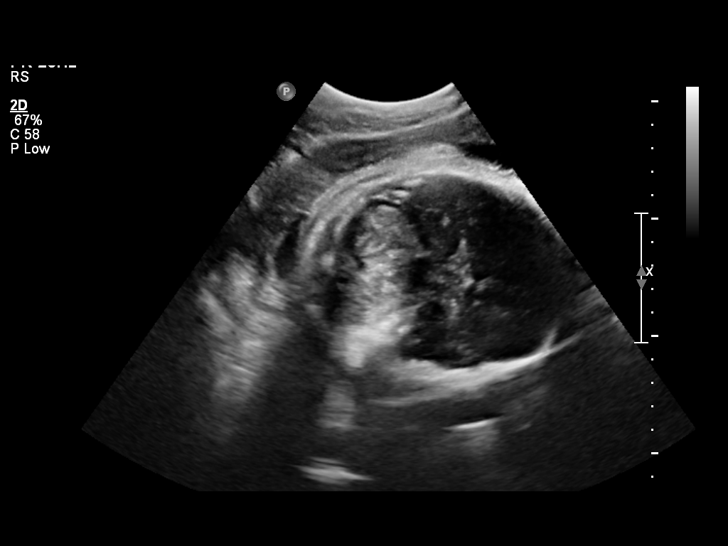
[im 25/45]
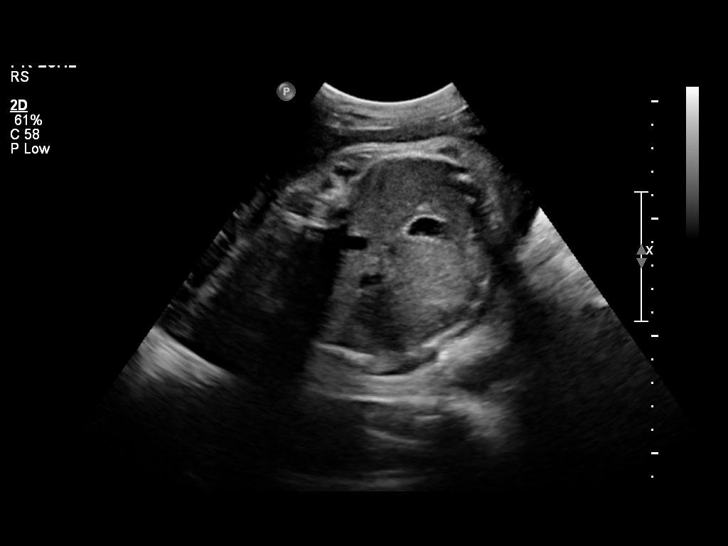
[im 28/45]
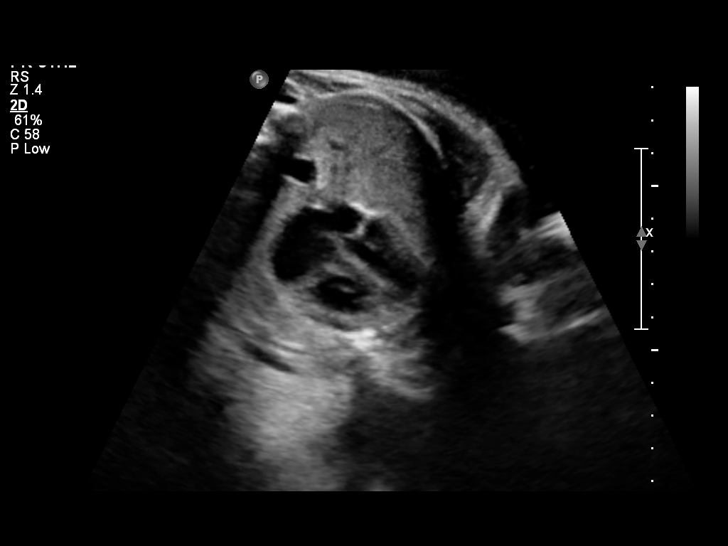
[im 31/45]
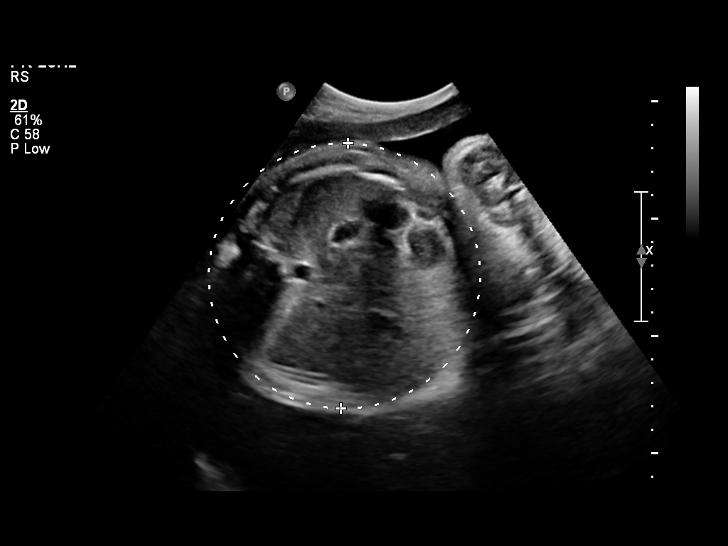
[im 35/45]
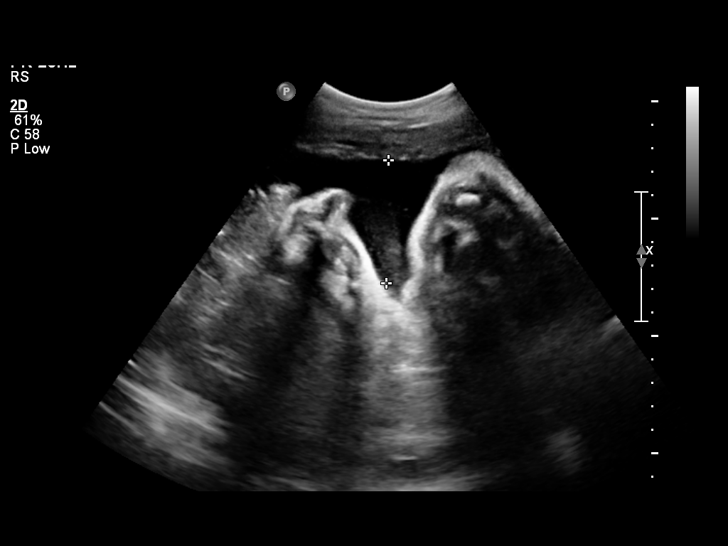
[im 38/45]
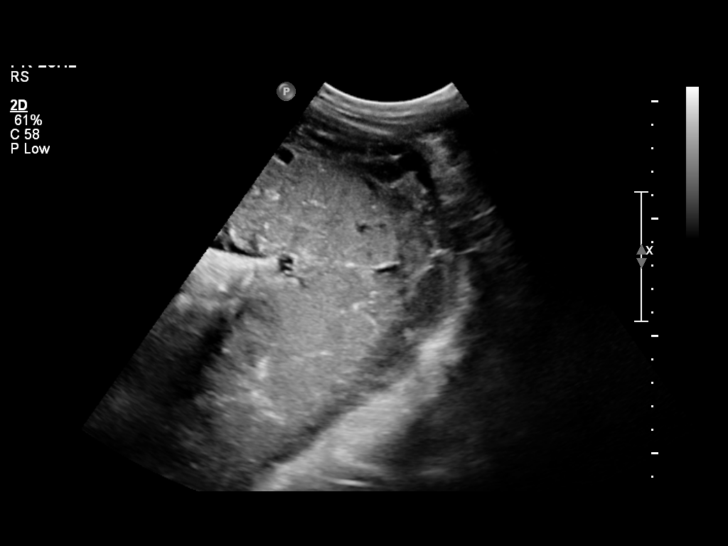
[im 41/45]
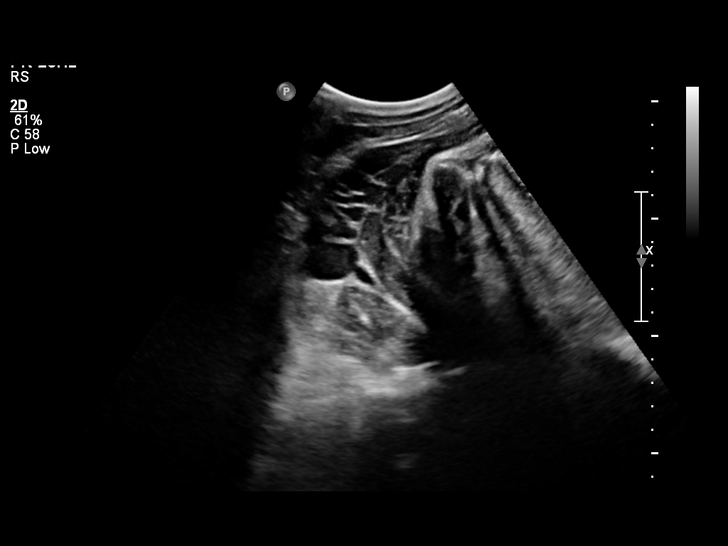
[im 45/45]
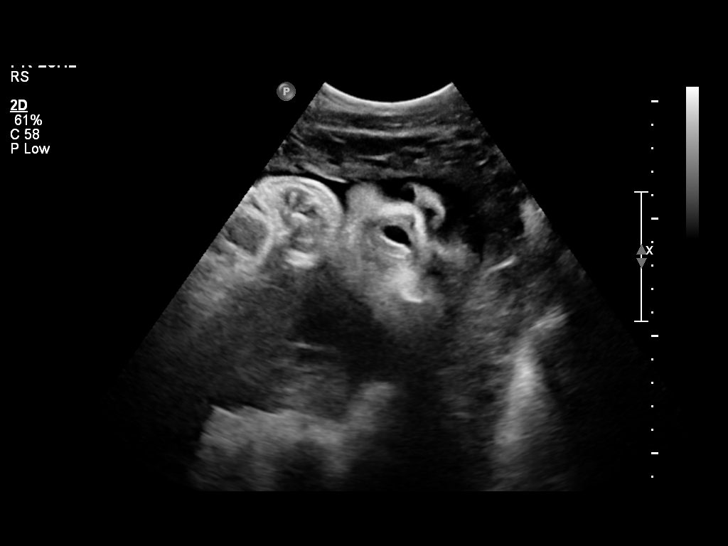

[14 of 28 positions shown; findings below may reference images not displayed]

IMPRESSION: See AS Obstetric US report.

## 2012-01-08 ENCOUNTER — Emergency Department (INDEPENDENT_AMBULATORY_CARE_PROVIDER_SITE_OTHER)
Admission: EM | Admit: 2012-01-08 | Discharge: 2012-01-08 | Disposition: A | Discharge status: Home / Self Care | Payer: Self-pay | Source: Home / Self Care | Attending: Family Medicine | Admitting: Family Medicine

## 2012-01-08 ENCOUNTER — Encounter (HOSPITAL_COMMUNITY): Payer: Self-pay | Admitting: *Deleted

## 2012-01-08 DIAGNOSIS — R110 Nausea: Secondary | ICD-10-CM

## 2012-01-08 DIAGNOSIS — J069 Acute upper respiratory infection, unspecified: Secondary | ICD-10-CM

## 2012-01-08 HISTORY — DX: Essential (primary) hypertension: I10

## 2012-01-08 LAB — POCT PREGNANCY, URINE: Preg Test, Ur: NEGATIVE

## 2012-01-08 MED ORDER — ONDANSETRON HCL 4 MG PO TABS: 4.0000 mg | ORAL_TABLET | Freq: Four times a day (QID) | ORAL | Status: AC

## 2012-01-08 NOTE — ED Provider Notes (Signed)
Medical screening examination/treatment/procedure(s) were performed by resident physician or non-physician practitioner and as supervising physician I was immediately available for consultation/collaboration.   KINDL,JAMES DOUGLAS MD.    James D Kindl, MD 01/08/12 2138 

## 2012-01-08 NOTE — ED Notes (Signed)
Pt reports nausea with no relief from otc meds- states that there is a possibility of pregnancy. Also reports sinus infection symptoms for the past two days.

## 2012-01-08 NOTE — ED Provider Notes (Signed)
History     CSN: [id number]  Arrival date & time 01/08/12  1434   First MD Initiated Contact with Patient 01/08/12 1503      Chief Complaint  Patient presents with  . Facial Pain  . Nausea    (Consider location/radiation/quality/duration/timing/severity/associated sxs/prior treatment) HPI Comments: Mild nausea for 2 d, no vomiting or abdominal pain. LMP 12/11/11 and a few d late. Usually all ROT q 28 d. Sexually active with no BC. Wonders if pregnant.   Also with mild upper respiratory congestion. Has tried some OTC meds that help. No fever, chills and reports sx's as mild   Past Medical History  Diagnosis Date  . Hypertension     History reviewed. No pertinent past surgical history.  Family History  Problem Relation Age of Onset  . Diabetes Father     History  Substance Use Topics  . Smoking status: Current Some Day Smoker    Types: Cigarettes  . Smokeless tobacco: Not on file  . Alcohol Use: Yes     socially    OB History    Grav Para Term Preterm Abortions TAB SAB Ect Mult Living                  Review of Systems  Constitutional: Positive for appetite change. Negative for fever, chills, activity change and fatigue.  HENT: Positive for congestion, rhinorrhea and postnasal drip. Negative for neck stiffness and ear discharge.   Respiratory: Negative.   Cardiovascular: Negative.   Gastrointestinal: Positive for nausea. Negative for vomiting, abdominal pain, constipation, blood in stool, abdominal distention and anal bleeding.  Genitourinary: Negative.   Skin: Negative.     Allergies  Review of patient's allergies indicates no known allergies.  Home Medications   Current Outpatient Rx  Name Route Sig Dispense Refill  . HYDROCHLOROTHIAZIDE 25 MG PO TABS Oral Take 25 mg by mouth daily.      Marland Kitchen ONDANSETRON HCL 4 MG PO TABS Oral Take 1 tablet (4 mg total) by mouth every 6 (six) hours. 12 tablet 0    BP 142/83  Pulse 76  Temp 98.8 F (37.1 C) (Oral)   Resp 18  SpO2 100%  LMP 12/13/2011  Physical Exam  Constitutional: She is oriented to person, place, and time. She appears well-developed and well-nourished.  HENT:  Right Ear: External ear normal.  Left Ear: External ear normal.  Mouth/Throat: Oropharynx is clear and moist.  Eyes: Conjunctivae are normal. Pupils are equal, round, and reactive to light.  Neck: Normal range of motion. Neck supple. No thyromegaly present.  Cardiovascular: Normal rate and normal heart sounds.   Pulmonary/Chest: Effort normal and breath sounds normal.  Abdominal: Soft. Bowel sounds are normal. She exhibits no distension and no mass. There is no tenderness. There is no rebound and no guarding.  Lymphadenopathy:    She has no cervical adenopathy.  Neurological: She is alert and oriented to person, place, and time. No cranial nerve deficit.  Skin: Skin is warm and dry.  Psychiatric: She has a normal mood and affect.    ED Course  Procedures (including critical care time)   Labs Reviewed  POCT PREGNANCY, URINE   No results found.   1. Nausea   2. URI (upper respiratory infection)       MDM  zofran 4mg  as directed for nausea. UPT is negative.  Instruction for UTI and may take OTC cold med of choice.         Hayden Rasmussen, NP  01/08/12 1614 

## 2012-11-14 ENCOUNTER — Encounter (HOSPITAL_COMMUNITY): Payer: Self-pay

## 2012-11-14 ENCOUNTER — Emergency Department (HOSPITAL_COMMUNITY)
Admission: EM | Admit: 2012-11-14 | Discharge: 2012-11-14 | Disposition: A | Discharge status: Home / Self Care | Payer: Medicaid Other | Attending: Emergency Medicine | Admitting: Emergency Medicine

## 2012-11-14 DIAGNOSIS — O10019 Pre-existing essential hypertension complicating pregnancy, unspecified trimester: Secondary | ICD-10-CM | POA: Insufficient documentation

## 2012-11-14 DIAGNOSIS — O219 Vomiting of pregnancy, unspecified: Secondary | ICD-10-CM | POA: Insufficient documentation

## 2012-11-14 DIAGNOSIS — O9989 Other specified diseases and conditions complicating pregnancy, childbirth and the puerperium: Secondary | ICD-10-CM | POA: Insufficient documentation

## 2012-11-14 DIAGNOSIS — R51 Headache: Secondary | ICD-10-CM | POA: Insufficient documentation

## 2012-11-14 DIAGNOSIS — Z87891 Personal history of nicotine dependence: Secondary | ICD-10-CM | POA: Insufficient documentation

## 2012-11-14 DIAGNOSIS — I10 Essential (primary) hypertension: Secondary | ICD-10-CM

## 2012-11-14 DIAGNOSIS — H53149 Visual discomfort, unspecified: Secondary | ICD-10-CM | POA: Insufficient documentation

## 2012-11-14 DIAGNOSIS — R109 Unspecified abdominal pain: Secondary | ICD-10-CM | POA: Insufficient documentation

## 2012-11-14 DIAGNOSIS — O26891 Other specified pregnancy related conditions, first trimester: Secondary | ICD-10-CM

## 2012-11-14 DIAGNOSIS — Z79899 Other long term (current) drug therapy: Secondary | ICD-10-CM | POA: Insufficient documentation

## 2012-11-14 LAB — POCT PREGNANCY, URINE: Preg Test, Ur: POSITIVE — AB

## 2012-11-14 MED ORDER — PRENATAL MULTIVITAMIN CH: 1.0000 | ORAL_TABLET | Freq: Every day | ORAL | Status: AC

## 2012-11-14 MED ORDER — ONDANSETRON 8 MG PO TBDP
8.0000 mg | ORAL_TABLET | Freq: Once | ORAL | Status: AC
  Administered 2012-11-14: 8 mg via ORAL
  Filled 2012-11-14: qty 1

## 2012-11-14 MED ORDER — HYDROCHLOROTHIAZIDE 12.5 MG PO CAPS: 12.5000 mg | ORAL_CAPSULE | Freq: Every day | ORAL | Status: DC

## 2012-11-14 MED ORDER — ONDANSETRON HCL 4 MG PO TABS: 4.0000 mg | ORAL_TABLET | Freq: Four times a day (QID) | ORAL | Status: DC

## 2012-11-14 MED ORDER — METOCLOPRAMIDE HCL 5 MG/ML IJ SOLN
10.0000 mg | Freq: Once | INTRAMUSCULAR | Status: AC
  Administered 2012-11-14: 10 mg via INTRAMUSCULAR
  Filled 2012-11-14: qty 2

## 2012-11-14 NOTE — ED Notes (Signed)
Patient states that she wasn't feeling well last night. Patient had a stomach ache with lite HA. She awoke this morning with a migraine. She  Is a little over [redacted] weeks pregnant. Has not taken anything. States that tylenol does not work for her.

## 2012-11-14 NOTE — ED Provider Notes (Signed)
  Medical screening examination/treatment/procedure(s) were performed by non-physician practitioner and as supervising physician I was immediately available for consultation/collaboration.    Shawntae Lowy, MD 11/14/12 2339 

## 2012-11-14 NOTE — Progress Notes (Signed)
[facility] has seen patient and provided her with a list of primary care resources. °

## 2012-11-14 NOTE — ED Provider Notes (Signed)
History    CSN: [id number] Arrival date & time 11/14/12  1435  First MD Initiated Contact with Patient 11/14/12 1503     Chief Complaint  Patient presents with  . Migraine   (Consider location/radiation/quality/duration/timing/severity/associated sxs/prior Treatment) HPI Comments: This is a 27 year old G3P2 female PMHx significant for HTN presenting to the emergency department for a headache that began this morning. Patient states her headache is throbbing in nature located across her forehead. Patient rates her pain currently 4/10 w/ no alleviating factors. Patient states light aggravates her headache. She has associated nausea and vomiting w/ generalized abdominal discomfort. Patient states she has been dealing with headaches since she first became pregnant at the end of May. Patient states that she gets headaches with her pregnancies and this headache has felt like previous headaches she has had. Patient denies any history of eclampsia during her past two pregnancies. She denies any fevers, chills, pelvic cramping, vaginal bleeding or discharge. Patient is requesting pregnancy test to file for Pregnancy Medicaid. Patient is not currently on her HCTZ for HTN.     Past Medical History  Diagnosis Date  . Hypertension    History reviewed. No pertinent past surgical history. Family History  Problem Relation Age of Onset  . Diabetes Father    History  Substance Use Topics  . Smoking status: Former Games developer  . Smokeless tobacco: Not on file  . Alcohol Use: Yes     Comment: socially   OB History   Grav Para Term Preterm Abortions TAB SAB Ect Mult Living                 Review of Systems  Constitutional: Negative for fever and chills.  HENT: Negative for facial swelling, neck pain and neck stiffness.   Eyes: Positive for photophobia.  Respiratory: Negative for shortness of breath.   Cardiovascular: Negative for chest pain.  Gastrointestinal: Positive for nausea, vomiting and  abdominal pain.  Genitourinary: Negative for dysuria, vaginal bleeding, vaginal discharge and vaginal pain.  Musculoskeletal: Negative for back pain.  Skin: Negative.   Neurological: Positive for headaches. Negative for syncope, weakness and numbness.    Allergies  Coconut oil  Home Medications   Current Outpatient Rx  Name  Route  Sig  Dispense  Refill  . acetaminophen (TYLENOL) 500 MG tablet   Oral   Take 1,000 mg by mouth every 6 (six) hours as needed for pain.         . hydrochlorothiazide (MICROZIDE) 12.5 MG capsule   Oral   Take 1 capsule (12.5 mg total) by mouth daily.   30 capsule   0   . ondansetron (ZOFRAN) 4 MG tablet   Oral   Take 1 tablet (4 mg total) by mouth every 6 (six) hours.   12 tablet   0   . Prenatal Vit-Fe Fumarate-FA (PRENATAL MULTIVITAMIN) TABS   Oral   Take 1 tablet by mouth daily at 12 noon.   30 tablet   0    BP 151/92  Temp(Src) 99 F (37.2 C)  Resp 22  SpO2 100%  LMP 09/16/2012 Physical Exam  Constitutional: She is oriented to person, place, and time. She appears well-developed and well-nourished. No distress.  HENT:  Head: Normocephalic and atraumatic.  Mouth/Throat: Oropharynx is clear and moist.  Eyes: Conjunctivae and EOM are normal. Pupils are equal, round, and reactive to light.  Neck: Normal range of motion. Neck supple.  Cardiovascular: Normal rate, regular rhythm, normal heart sounds and intact  distal pulses.   Pulmonary/Chest: Effort normal and breath sounds normal. No respiratory distress.  Abdominal: Soft. Bowel sounds are normal. There is no tenderness.  Musculoskeletal: Normal range of motion.  Neurological: She is alert and oriented to person, place, and time. No cranial nerve deficit.  Skin: Skin is warm. She is not diaphoretic.    ED Course  Procedures (including critical care time)  Medications  metoCLOPramide (REGLAN) injection 10 mg (not administered)  ondansetron (ZOFRAN-ODT) disintegrating tablet 8 mg  (not administered)    Labs Reviewed  POCT PREGNANCY, URINE - Abnormal; Notable for the following:    Preg Test, Ur POSITIVE (*)    All other components within normal limits   No results found. 1. Headache in pregnancy, first trimester   2. Hypertension     MDM  Pt HA treated and improved while in ED.  Presentation is like pts typical HA and non concerning for [facility], ICH, Meningitis, or temporal arteritis. Pt is afebrile with no focal neuro deficits, nuchal rigidity, or change in vision. Patient noted to be hypertensive in the emergency department, patient does have history of HTN, but no history of pre-eclampsia or eclampsia. Pt was on HCTZ previously, but does not have any of her medication. No signs of hypertensive urgency.  Discussed with patient the need for close follow-up and management by their primary care physician and Ob/Gyn for continued management of HTN during pregnancy. Discussed return precautions. Patient d/w with Dr. Jeraldine Loots, agrees with plan. Patient is agreeable to plan. Patient is stable at time of discharge        Jeannetta Ellis, PA-C 11/14/12 2105

## 2012-12-04 ENCOUNTER — Inpatient Hospital Stay (HOSPITAL_COMMUNITY)
Admission: AD | Admit: 2012-12-04 | Discharge: 2012-12-04 | Disposition: A | Discharge status: Home / Self Care | Payer: Medicaid Other | Source: Ambulatory Visit | Attending: Obstetrics & Gynecology | Admitting: Obstetrics & Gynecology

## 2012-12-04 ENCOUNTER — Encounter (HOSPITAL_COMMUNITY): Payer: Self-pay | Admitting: *Deleted

## 2012-12-04 DIAGNOSIS — R109 Unspecified abdominal pain: Secondary | ICD-10-CM | POA: Insufficient documentation

## 2012-12-04 DIAGNOSIS — O26899 Other specified pregnancy related conditions, unspecified trimester: Secondary | ICD-10-CM

## 2012-12-04 DIAGNOSIS — O99891 Other specified diseases and conditions complicating pregnancy: Secondary | ICD-10-CM | POA: Insufficient documentation

## 2012-12-04 DIAGNOSIS — I10 Essential (primary) hypertension: Secondary | ICD-10-CM

## 2012-12-04 DIAGNOSIS — K5900 Constipation, unspecified: Secondary | ICD-10-CM | POA: Insufficient documentation

## 2012-12-04 DIAGNOSIS — O10019 Pre-existing essential hypertension complicating pregnancy, unspecified trimester: Secondary | ICD-10-CM | POA: Insufficient documentation

## 2012-12-04 HISTORY — DX: Anemia, unspecified: D64.9

## 2012-12-04 HISTORY — DX: Headache: R51

## 2012-12-04 LAB — URINALYSIS, ROUTINE W REFLEX MICROSCOPIC
Bilirubin Urine: NEGATIVE
Hgb urine dipstick: NEGATIVE
Nitrite: NEGATIVE
Protein, ur: NEGATIVE mg/dL
Urobilinogen, UA: 0.2 mg/dL (ref 0.0–1.0)

## 2012-12-04 MED ORDER — LABETALOL HCL 100 MG PO TABS: 100.0000 mg | ORAL_TABLET | Freq: Two times a day (BID) | ORAL | Status: DC

## 2012-12-04 MED ORDER — POLYETHYLENE GLYCOL 3350 17 GM/SCOOP PO POWD: 17.0000 g | Freq: Every day | ORAL | Status: DC

## 2012-12-04 MED ORDER — DOCUSATE SODIUM 100 MG PO CAPS: 100.0000 mg | ORAL_CAPSULE | Freq: Two times a day (BID) | ORAL | Status: DC

## 2012-12-04 NOTE — MAU Note (Signed)
C/o intermittent abdominal pain; c/o nausea; hx of hypertension;

## 2012-12-04 NOTE — MAU Provider Note (Signed)
History     CSN: [id number]  Arrival date and time: 12/04/12 1012   First Provider Initiated Contact with Patient 12/04/12 1046      Chief Complaint  Patient presents with  . Abdominal Pain   HPI  Pt is [redacted]w[redacted]d pregnant with abdominal cramping that started last night. Pt was seen on 7/11 at [facility] with Migraine.  Pt's BP was elevated and was was given HCT and pt has not had h/a since that time.  Pt has had constipation.  Pain does not hurt when she walks.  Pt denies bleeding or spotting or vaginal discharge.  Pt has new OB appointment on Tuesday at [facility]. Pt has been nauseated but not vomiting. Pt has not needed to take Zofran for her nausea. Pt denies UTI sx.  Pt last had IC 1 -1/2 weeks ago. Past Medical History  Diagnosis Date  . Hypertension   . Headache(784.0)   . Anemia     Past Surgical History  Procedure Laterality Date  . No past surgeries      Family History  Problem Relation Age of Onset  . Diabetes Father     History  Substance Use Topics  . Smoking status: Former Games developer  . Smokeless tobacco: Not on file  . Alcohol Use: Yes     Comment: socially    Allergies:  Allergies  Allergen Reactions  . Coconut Oil Anaphylaxis    Prescriptions prior to admission  Medication Sig Dispense Refill  . acetaminophen (TYLENOL) 500 MG tablet Take 1,000 mg by mouth every 6 (six) hours as needed for pain.      . hydrochlorothiazide (MICROZIDE) 12.5 MG capsule Take 1 capsule (12.5 mg total) by mouth daily.  30 capsule  0  . ondansetron (ZOFRAN) 4 MG tablet Take 1 tablet (4 mg total) by mouth every 6 (six) hours.  12 tablet  0  . Prenatal Vit-Fe Fumarate-FA (PRENATAL MULTIVITAMIN) TABS Take 1 tablet by mouth daily at 12 noon.  30 tablet  0    Review of Systems  Constitutional: Negative for fever and chills.  Gastrointestinal: Positive for nausea, abdominal pain and constipation. Negative for vomiting and diarrhea.  Genitourinary: Negative for dysuria  and urgency.   Physical Exam   Blood pressure 148/94, pulse 97, temperature 98.8 F (37.1 C), temperature source Axillary, resp. rate 18, height 5\' 3"  (1.6 m), weight 56.7 kg (125 lb), last menstrual period 09/16/2012.  Physical Exam  Vitals reviewed. Constitutional: She is oriented to person, place, and time. She appears well-developed and well-nourished. No distress.  HENT:  Head: Normocephalic.  Eyes: Pupils are equal, round, and reactive to light.  Neck: Normal range of motion. Neck supple.  Cardiovascular: Normal rate.   Respiratory: Effort normal.  GI: Soft. She exhibits no distension. There is no tenderness. There is no rebound and no guarding.  FHT audible with doppler  Musculoskeletal: Normal range of motion.  Neurological: She is alert and oriented to person, place, and time.  Skin: Skin is warm and dry.  Psychiatric: She has a normal mood and affect.    MAU Course  Procedures Results for orders placed during the hospital encounter of 12/04/12 (from the past 24 hour(s))  URINALYSIS, ROUTINE W REFLEX MICROSCOPIC     Status: Abnormal   Collection Time    12/04/12 10:55 AM      Result Value Range   Color, Urine YELLOW  YELLOW   APPearance CLOUDY (*) CLEAR   Specific Gravity, Urine >1.030 (*)  1.005 - 1.030   pH 6.0  5.0 - 8.0   Glucose, UA NEGATIVE  NEGATIVE mg/dL   Hgb urine dipstick NEGATIVE  NEGATIVE   Bilirubin Urine NEGATIVE  NEGATIVE   Ketones, ur 40 (*) NEGATIVE mg/dL   Protein, ur NEGATIVE  NEGATIVE mg/dL   Urobilinogen, UA 0.2  0.0 - 1.0 mg/dL   Nitrite NEGATIVE  NEGATIVE   Leukocytes, UA SMALL (*) NEGATIVE  URINE MICROSCOPIC-ADD ON     Status: Abnormal   Collection Time    12/04/12 10:55 AM      Result Value Range   Squamous Epithelial / LPF MANY (*) RARE   WBC, UA 0-2  <3 WBC/hpf   Bacteria, UA FEW (*) RARE   Urine-Other MUCOUS PRESENT      Assessment and Plan  abd pain in pregnancy Hypertension -stop HCTZ- start Labetalol 100mg  BID until appt  on Tues Constipation- stool softner and Miralax F/u with OB appointment scheduled   Kim Franklin 12/04/2012, 10:47 AM

## 2012-12-04 NOTE — MAU Provider Note (Signed)
Attestation of Attending Supervision of Advanced Practitioner (PA/CNM/NP): Evaluation and management procedures were performed by the Advanced Practitioner under my supervision and collaboration.  I have reviewed the Advanced Practitioner's note and chart, and I agree with the management and plan.  Helmi Hechavarria, MD, FACOG Attending Obstetrician & Gynecologist Faculty Practice, [facility]  

## 2012-12-09 LAB — OB RESULTS CONSOLE GC/CHLAMYDIA
CHLAMYDIA, DNA PROBE: NEGATIVE
Gonorrhea: NEGATIVE

## 2012-12-09 LAB — OB RESULTS CONSOLE RPR: RPR: NONREACTIVE

## 2012-12-09 LAB — OB RESULTS CONSOLE RUBELLA ANTIBODY, IGM: Rubella: IMMUNE

## 2012-12-09 LAB — OB RESULTS CONSOLE ABO/RH: RH TYPE: NEGATIVE

## 2012-12-09 LAB — OB RESULTS CONSOLE HEPATITIS B SURFACE ANTIGEN: HEP B S AG: NEGATIVE

## 2012-12-09 LAB — OB RESULTS CONSOLE ANTIBODY SCREEN: ANTIBODY SCREEN: NEGATIVE

## 2012-12-09 LAB — OB RESULTS CONSOLE HIV ANTIBODY (ROUTINE TESTING): HIV: NONREACTIVE

## 2013-05-07 NOTE — L&D Delivery Note (Signed)
Operative Delivery Note CTSP for deep variable decels with slow recovery.  Upon arricval, FHR with deep variable decels to 60's and cervix rim.  Pt prepped for delivery and insstructed to push.  Baby direct OP and did not rotate with pushing.  FHR continued to have variables, but less severe and recovering between pushes.  NICU team present for the fetal decels.  Pt pushed vertex down to +3 station and variables did not fully resolve, so pt counseled for a vacuum assisted vaginal delivery.  The vertex was pulled down to crowning with one pop-off over several contractions.  The pt pushed out the remainder of the head and baby delivered with cord around the arm. At 12:50 PM a viable female was delivered via Vaginal, Vacuum Investment banker, operational(Extractor).  Presentation: vertex; Position: Occiput,, Anterior; Station: +3.  Verbal consent: obtained from patient.  Risks and benefits discussed in detail.  Risks include, but are not limited to the risks of anesthesia, bleeding, infection, damage to maternal tissues, fetal cephalhematoma.  There is also the risk of inability to effect vaginal delivery of the head, or shoulder dystocia that cannot be resolved by established maneuvers, leading to the need for emergency cesarean section.  APGAR: 8, 9; weight .   Placenta status: Intact, Spontaneous.   Cord: 3 vessels with the following complications: None.  Cord pH: pending  Anesthesia: Epidural  Instruments: Bell cup vacuum Episiotomy: None Lacerations: 1st degree Suture Repair: 3.0 vicryl rapide Est. Blood Loss (mL): 350cc  Mom to postpartum.  Baby to Couplet care / Skin to Skin.  Oliver PilaICHARDSON,Adelard Sanon W 06/11/2013, 2:10 PM

## 2013-05-20 LAB — OB RESULTS CONSOLE GBS: GBS: NEGATIVE

## 2013-06-03 ENCOUNTER — Telehealth (HOSPITAL_COMMUNITY): Payer: Self-pay | Admitting: *Deleted

## 2013-06-03 ENCOUNTER — Encounter (HOSPITAL_COMMUNITY): Payer: Self-pay | Admitting: *Deleted

## 2013-06-03 NOTE — Telephone Encounter (Signed)
Preadmission screen  

## 2013-06-08 ENCOUNTER — Telehealth (HOSPITAL_COMMUNITY): Payer: Self-pay | Admitting: *Deleted

## 2013-06-08 ENCOUNTER — Encounter (HOSPITAL_COMMUNITY): Payer: Self-pay | Admitting: *Deleted

## 2013-06-08 NOTE — Telephone Encounter (Signed)
Preadmission screen  

## 2013-06-10 NOTE — H&P (Signed)
Kim PesaLamika Franklin is a 28 y.o. female G3P2002 at 39+ weeks (EDD 06/17/13 by 12 weekUS) presenting for IOL at term for mild PIH with underlying CHTN.  Prenatal care significant for h/o borderline hypertension treated with HCTZ before pregnancy and bp stable off meds for most of pregnancy.  For last 2 weeks BP had been a bit higher in the office with diastolics in the 90's, but no other evidence of preeclampsia.  The patient is a sickle trait carrier, but the FOB is not.She desires an Essure postpartum.  Maternal Medical History:  Contractions: Frequency: irregular.   Perceived severity is mild.    Fetal activity: Perceived fetal activity is normal.    Prenatal complications: PIH.   Prenatal Complications - Diabetes: none.    OB History   Grav Para Term Preterm Abortions TAB SAB Ect Mult Living   3 2 2       2     2010 8#6oz NSVD 2011 8# NSVD  Past Medical History  Diagnosis Date  . Hypertension   . Headache(784.0)   . Anemia   . Eczema    Past Surgical History  Procedure Laterality Date  . No past surgeries     Family History: family history includes Diabetes in her father; Thyroid disease in her mother. Social History:  reports that she quit smoking about a year ago. She has never used smokeless tobacco. She reports that she drinks alcohol. She reports that she does not use illicit drugs.   Prenatal Transfer Tool  Maternal Diabetes: No Genetic Screening: Normal Maternal Ultrasounds/Referrals: Normal Fetal Ultrasounds or other Referrals:  None Maternal Substance Abuse:  No Significant Maternal Medications:  None Significant Maternal Lab Results:  None Other Comments:  None  ROS    Last menstrual period 09/16/2012. Exam Physical Exam  Constitutional: She is oriented to person, place, and time. She appears well-developed and well-nourished.  Cardiovascular: Normal rate and regular rhythm.   Respiratory: Effort normal.  GI: Soft. Bowel sounds are normal.  Genitourinary:  Vagina normal and uterus normal.  Neurological: She is alert and oriented to person, place, and time.  Psychiatric: She has a normal mood and affect. Her behavior is normal.    Prenatal labs: ABO, Rh: A/Negative/-- (08/05 0000) Antibody: Negative (08/05 0000) Rubella: Immune (08/05 0000) RPR: Nonreactive (08/05 0000)  HBsAg: Negative (08/05 0000)  HIV: Non-reactive (08/05 0000) GBS: Negative (01/14 0000)  One hour GTT WNL First trimester screen WNL Hemoglobin AS  Assessment/Plan: IOL at term for mildly increasing BP and a h/o CHTN. Plan Pitocin and AROM.  Oliver PilaRICHARDSON,Phyillis Dascoli W 06/10/2013, 8:18 PM

## 2013-06-11 ENCOUNTER — Inpatient Hospital Stay (HOSPITAL_COMMUNITY): Payer: Medicaid Other | Admitting: Anesthesiology

## 2013-06-11 ENCOUNTER — Encounter (HOSPITAL_COMMUNITY): Payer: Self-pay

## 2013-06-11 ENCOUNTER — Inpatient Hospital Stay (HOSPITAL_COMMUNITY)
Admission: RE | Admit: 2013-06-11 | Discharge: 2013-06-12 | DRG: 774 | Disposition: A | Discharge status: Home / Self Care | Payer: Medicaid Other | Source: Ambulatory Visit | Attending: Obstetrics and Gynecology | Admitting: Obstetrics and Gynecology

## 2013-06-11 ENCOUNTER — Encounter (HOSPITAL_COMMUNITY): Payer: Medicaid Other | Admitting: Anesthesiology

## 2013-06-11 VITALS — BP 133/81 | HR 76 | Temp 97.6°F | Resp 20 | Ht 63.0 in | Wt 155.0 lb

## 2013-06-11 DIAGNOSIS — O9902 Anemia complicating childbirth: Secondary | ICD-10-CM | POA: Diagnosis present

## 2013-06-11 DIAGNOSIS — D573 Sickle-cell trait: Secondary | ICD-10-CM | POA: Diagnosis present

## 2013-06-11 DIAGNOSIS — Z87891 Personal history of nicotine dependence: Secondary | ICD-10-CM

## 2013-06-11 DIAGNOSIS — O139 Gestational [pregnancy-induced] hypertension without significant proteinuria, unspecified trimester: Secondary | ICD-10-CM | POA: Diagnosis present

## 2013-06-11 DIAGNOSIS — O1002 Pre-existing essential hypertension complicating childbirth: Principal | ICD-10-CM | POA: Diagnosis present

## 2013-06-11 LAB — CBC
HEMATOCRIT: 28.5 % — AB (ref 36.0–46.0)
HEMATOCRIT: 28.5 % — AB (ref 36.0–46.0)
HEMOGLOBIN: 9.9 g/dL — AB (ref 12.0–15.0)
Hemoglobin: 10 g/dL — ABNORMAL LOW (ref 12.0–15.0)
MCH: 24.2 pg — ABNORMAL LOW (ref 26.0–34.0)
MCH: 24.1 pg — ABNORMAL LOW (ref 26.0–34.0)
MCHC: 35.1 g/dL (ref 30.0–36.0)
MCHC: 34.7 g/dL (ref 30.0–36.0)
MCV: 68.8 fL — AB (ref 78.0–100.0)
MCV: 69.5 fL — AB (ref 78.0–100.0)
Platelets: 146 10*3/uL — ABNORMAL LOW (ref 150–400)
Platelets: 137 10*3/uL — ABNORMAL LOW (ref 150–400)
RBC: 4.14 MIL/uL (ref 3.87–5.11)
RBC: 4.1 MIL/uL (ref 3.87–5.11)
RDW: 16.4 % — AB (ref 11.5–15.5)
RDW: 16.7 % — ABNORMAL HIGH (ref 11.5–15.5)
WBC: 6.4 10*3/uL (ref 4.0–10.5)
WBC: 9.1 10*3/uL (ref 4.0–10.5)

## 2013-06-11 LAB — RPR: RPR Ser Ql: NONREACTIVE

## 2013-06-11 MED ORDER — LIDOCAINE HCL (PF) 1 % IJ SOLN
30.0000 mL | INTRAMUSCULAR | Status: DC | PRN
  Filled 2013-06-11: qty 30

## 2013-06-11 MED ORDER — OXYTOCIN 40 UNITS IN LACTATED RINGERS INFUSION - SIMPLE MED
1.0000 m[IU]/min | INTRAVENOUS | Status: DC
  Administered 2013-06-11: 2 m[IU]/min via INTRAVENOUS
  Filled 2013-06-11: qty 1000

## 2013-06-11 MED ORDER — PHENYLEPHRINE 40 MCG/ML (10ML) SYRINGE FOR IV PUSH (FOR BLOOD PRESSURE SUPPORT)
80.0000 ug | PREFILLED_SYRINGE | INTRAVENOUS | Status: DC | PRN
  Filled 2013-06-11: qty 2

## 2013-06-11 MED ORDER — SIMETHICONE 80 MG PO CHEW
80.0000 mg | CHEWABLE_TABLET | ORAL | Status: DC | PRN
Start: 2013-06-11 — End: 2013-06-12

## 2013-06-11 MED ORDER — LACTATED RINGERS IV SOLN: 500.0000 mL | Freq: Once | INTRAVENOUS | Status: DC

## 2013-06-11 MED ORDER — LACTATED RINGERS IV SOLN
INTRAVENOUS | Status: DC
  Administered 2013-06-11: 12:00:00 via INTRAUTERINE

## 2013-06-11 MED ORDER — LACTATED RINGERS IV SOLN: 500.0000 mL | INTRAVENOUS | Status: DC | PRN

## 2013-06-11 MED ORDER — LANOLIN HYDROUS EX OINT
TOPICAL_OINTMENT | CUTANEOUS | Status: DC | PRN
Start: 2013-06-11 — End: 2013-06-12

## 2013-06-11 MED ORDER — SENNOSIDES-DOCUSATE SODIUM 8.6-50 MG PO TABS
2.0000 | ORAL_TABLET | ORAL | Status: DC
  Administered 2013-06-12: 2 via ORAL
  Filled 2013-06-11: qty 2

## 2013-06-11 MED ORDER — WITCH HAZEL-GLYCERIN EX PADS
1.0000 | MEDICATED_PAD | CUTANEOUS | Status: DC | PRN
Start: 2013-06-11 — End: 2013-06-12

## 2013-06-11 MED ORDER — IBUPROFEN 600 MG PO TABS
600.0000 mg | ORAL_TABLET | Freq: Four times a day (QID) | ORAL | Status: DC
  Administered 2013-06-11 – 2013-06-12 (×4): 600 mg via ORAL
  Filled 2013-06-11 (×4): qty 1

## 2013-06-11 MED ORDER — OXYTOCIN 40 UNITS IN LACTATED RINGERS INFUSION - SIMPLE MED: 62.5000 mL/h | INTRAVENOUS | Status: DC

## 2013-06-11 MED ORDER — ONDANSETRON HCL 4 MG/2ML IJ SOLN: 4.0000 mg | INTRAMUSCULAR | Status: DC | PRN

## 2013-06-11 MED ORDER — ONDANSETRON HCL 4 MG PO TABS: 4.0000 mg | ORAL_TABLET | ORAL | Status: DC | PRN

## 2013-06-11 MED ORDER — EPHEDRINE 5 MG/ML INJ
10.0000 mg | INTRAVENOUS | Status: DC | PRN
Start: 2013-06-11 — End: 2013-06-11
  Filled 2013-06-11: qty 4
  Filled 2013-06-11: qty 2

## 2013-06-11 MED ORDER — PRENATAL MULTIVITAMIN CH
1.0000 | ORAL_TABLET | Freq: Every day | ORAL | Status: DC
  Administered 2013-06-12: 1 via ORAL
  Filled 2013-06-11: qty 1

## 2013-06-11 MED ORDER — LACTATED RINGERS IV SOLN
INTRAVENOUS | Status: DC
  Administered 2013-06-11: 07:00:00 via INTRAVENOUS

## 2013-06-11 MED ORDER — BENZOCAINE-MENTHOL 20-0.5 % EX AERO
1.0000 | INHALATION_SPRAY | CUTANEOUS | Status: DC | PRN
Start: 2013-06-11 — End: 2013-06-12
  Administered 2013-06-11: 1 via TOPICAL
  Filled 2013-06-11: qty 56

## 2013-06-11 MED ORDER — BUTORPHANOL TARTRATE 1 MG/ML IJ SOLN: 1.0000 mg | INTRAMUSCULAR | Status: DC | PRN

## 2013-06-11 MED ORDER — CITRIC ACID-SODIUM CITRATE 334-500 MG/5ML PO SOLN
30.0000 mL | ORAL | Status: DC | PRN
  Filled 2013-06-11: qty 15

## 2013-06-11 MED ORDER — EPHEDRINE 5 MG/ML INJ
10.0000 mg | INTRAVENOUS | Status: DC | PRN
  Filled 2013-06-11: qty 2

## 2013-06-11 MED ORDER — PHENYLEPHRINE 40 MCG/ML (10ML) SYRINGE FOR IV PUSH (FOR BLOOD PRESSURE SUPPORT)
80.0000 ug | PREFILLED_SYRINGE | INTRAVENOUS | Status: DC | PRN
  Filled 2013-06-11: qty 10
  Filled 2013-06-11: qty 2

## 2013-06-11 MED ORDER — ACETAMINOPHEN 325 MG PO TABS: 650.0000 mg | ORAL_TABLET | ORAL | Status: DC | PRN

## 2013-06-11 MED ORDER — LIDOCAINE HCL (PF) 1 % IJ SOLN
INTRAMUSCULAR | Status: DC | PRN
  Administered 2013-06-11 (×2): 5 mL

## 2013-06-11 MED ORDER — TETANUS-DIPHTH-ACELL PERTUSSIS 5-2.5-18.5 LF-MCG/0.5 IM SUSP
0.5000 mL | Freq: Once | INTRAMUSCULAR | Status: AC
Start: 2013-06-12 — End: 2013-06-12
  Administered 2013-06-12: 0.5 mL via INTRAMUSCULAR
  Filled 2013-06-11: qty 0.5

## 2013-06-11 MED ORDER — IBUPROFEN 600 MG PO TABS: 600.0000 mg | ORAL_TABLET | Freq: Four times a day (QID) | ORAL | Status: DC | PRN

## 2013-06-11 MED ORDER — ZOLPIDEM TARTRATE 5 MG PO TABS: 5.0000 mg | ORAL_TABLET | Freq: Every evening | ORAL | Status: DC | PRN

## 2013-06-11 MED ORDER — DIPHENHYDRAMINE HCL 25 MG PO CAPS
25.0000 mg | ORAL_CAPSULE | Freq: Four times a day (QID) | ORAL | Status: DC | PRN
Start: 2013-06-11 — End: 2013-06-12

## 2013-06-11 MED ORDER — FENTANYL 2.5 MCG/ML BUPIVACAINE 1/10 % EPIDURAL INFUSION (WH - ANES)
14.0000 mL/h | INTRAMUSCULAR | Status: DC | PRN
  Administered 2013-06-11: 14 mL/h via EPIDURAL
  Filled 2013-06-11: qty 125

## 2013-06-11 MED ORDER — DIBUCAINE 1 % RE OINT
1.0000 | TOPICAL_OINTMENT | RECTAL | Status: DC | PRN
Start: 2013-06-11 — End: 2013-06-12

## 2013-06-11 MED ORDER — OXYCODONE-ACETAMINOPHEN 5-325 MG PO TABS: 1.0000 | ORAL_TABLET | ORAL | Status: DC | PRN

## 2013-06-11 MED ORDER — OXYCODONE-ACETAMINOPHEN 5-325 MG PO TABS
1.0000 | ORAL_TABLET | ORAL | Status: DC | PRN
  Administered 2013-06-12: 1 via ORAL
  Filled 2013-06-11: qty 1

## 2013-06-11 MED ORDER — DIPHENHYDRAMINE HCL 50 MG/ML IJ SOLN: 12.5000 mg | INTRAMUSCULAR | Status: DC | PRN

## 2013-06-11 MED ORDER — ONDANSETRON HCL 4 MG/2ML IJ SOLN
4.0000 mg | Freq: Four times a day (QID) | INTRAMUSCULAR | Status: DC | PRN
Start: 2013-06-11 — End: 2013-06-11

## 2013-06-11 MED ORDER — OXYTOCIN BOLUS FROM INFUSION: 500.0000 mL | INTRAVENOUS | Status: DC

## 2013-06-11 MED ORDER — TERBUTALINE SULFATE 1 MG/ML IJ SOLN
0.2500 mg | Freq: Once | INTRAMUSCULAR | Status: AC | PRN
  Administered 2013-06-11: 0.25 mg via SUBCUTANEOUS
  Filled 2013-06-11: qty 1

## 2013-06-11 NOTE — Anesthesia Postprocedure Evaluation (Signed)
2 Anesthesia Post-op Note  Patient: Kim Franklin  Procedure(s) Performed: * No procedures listed *  Patient Location: Mother/Baby  Anesthesia Type:Epidural  Level of Consciousness: awake  Airway and Oxygen Therapy: Patient Spontanous Breathing  Post-op Pain: mild  Post-op Assessment: Patient's Cardiovascular Status Stable and Respiratory Function Stable  Post-op Vital Signs: stable  Complications: No apparent anesthesia complications

## 2013-06-11 NOTE — Anesthesia Procedure Notes (Signed)
Epidural Patient location during procedure: OB Start time: 06/11/2013 9:22 AM  Staffing Anesthesiologist: Brayton CavesJACKSON, Aziza Stuckert Performed by: anesthesiologist   Preanesthetic Checklist Completed: patient identified, site marked, surgical consent, pre-op evaluation, timeout performed, IV checked, risks and benefits discussed and monitors and equipment checked  Epidural Patient position: sitting Prep: site prepped and draped and DuraPrep Patient monitoring: continuous pulse ox and blood pressure Approach: midline Injection technique: LOR air  Needle:  Needle type: Tuohy  Needle gauge: 17 G Needle length: 9 cm and 9 Needle insertion depth: 6 cm Catheter type: closed end flexible Catheter size: 19 Gauge Catheter at skin depth: 11 cm Test dose: negative  Assessment Events: blood not aspirated, injection not painful, no injection resistance, negative IV test and no paresthesia  Additional Notes Patient identified.  Risk benefits discussed including failed block, incomplete pain control, headache, nerve damage, paralysis, blood pressure changes, nausea, vomiting, reactions to medication both toxic or allergic, and postpartum back pain.  Patient expressed understanding and wished to proceed.  All questions were answered.  Sterile technique used throughout procedure and epidural site dressed with sterile barrier dressing. No paresthesia or other complications noted.The patient did not experience any signs of intravascular injection such as tinnitus or metallic taste in mouth nor signs of intrathecal spread such as rapid motor block. Please see nursing notes for vital signs.

## 2013-06-11 NOTE — Progress Notes (Signed)
Patient ID: Kim Franklin, female   DOB: 05/16/1985, 28 y.o.   MRN: [id number] Pt having some irregular contractions  afeb vss Cervix 50/3/-2 AROM clear  FHR with some decreased variability, no decels On pitocin

## 2013-06-11 NOTE — Anesthesia Preprocedure Evaluation (Signed)
Anesthesia Evaluation  Patient identified by MRN, date of birth, ID band Patient awake    Reviewed: Allergy & Precautions, H&P , Patient's Chart, lab work & pertinent test results  Airway Mallampati: II TM Distance: >3 FB Neck ROM: full    Dental   Pulmonary former smoker,  breath sounds clear to auscultation        Cardiovascular hypertension, Rhythm:regular Rate:Normal     Neuro/Psych    GI/Hepatic   Endo/Other    Renal/GU      Musculoskeletal   Abdominal   Peds  Hematology  (+) anemia ,   Anesthesia Other Findings   Reproductive/Obstetrics (+) Pregnancy                           Anesthesia Physical Anesthesia Plan  ASA: III  Anesthesia Plan: Epidural   Post-op Pain Management:    Induction:   Airway Management Planned:   Additional Equipment:   Intra-op Plan:   Post-operative Plan:   Informed Consent: I have reviewed the patients History and Physical, chart, labs and discussed the procedure including the risks, benefits and alternatives for the proposed anesthesia with the patient or authorized representative who has indicated his/her understanding and acceptance.     Plan Discussed with:   Anesthesia Plan Comments:         Anesthesia Quick Evaluation

## 2013-06-12 LAB — CBC
HCT: 25.9 % — ABNORMAL LOW (ref 36.0–46.0)
Hemoglobin: 9.1 g/dL — ABNORMAL LOW (ref 12.0–15.0)
MCH: 24.3 pg — AB (ref 26.0–34.0)
MCHC: 35.1 g/dL (ref 30.0–36.0)
MCV: 69.1 fL — AB (ref 78.0–100.0)
PLATELETS: 135 10*3/uL — AB (ref 150–400)
RBC: 3.75 MIL/uL — AB (ref 3.87–5.11)
RDW: 16.7 % — ABNORMAL HIGH (ref 11.5–15.5)
WBC: 12.4 10*3/uL — ABNORMAL HIGH (ref 4.0–10.5)

## 2013-06-12 MED ORDER — RHO D IMMUNE GLOBULIN 1500 UNIT/2ML IJ SOLN
300.0000 ug | Freq: Once | INTRAMUSCULAR | Status: AC
  Administered 2013-06-12: 300 ug via INTRAVENOUS
  Filled 2013-06-12: qty 2

## 2013-06-12 MED ORDER — IBUPROFEN 600 MG PO TABS: 600.0000 mg | ORAL_TABLET | Freq: Four times a day (QID) | ORAL | Status: DC

## 2013-06-12 MED ORDER — OXYCODONE-ACETAMINOPHEN 5-325 MG PO TABS: 1.0000 | ORAL_TABLET | ORAL | Status: DC | PRN

## 2013-06-12 NOTE — Discharge Summary (Signed)
Obstetric Discharge Summary Reason for Admission: induction of labor Prenatal Procedures: none Intrapartum Procedures: spontaneous vaginal delivery Postpartum Procedures: none Complications-Operative and Postpartum: none Hemoglobin  Date Value Range Status  06/12/2013 9.1* 12.0 - 15.0 g/dL Final     HCT  Date Value Range Status  06/12/2013 25.9* 36.0 - 46.0 % Final    Physical Exam:  General: alert and cooperative Lochia: appropriate Uterine Fundus: firm  Discharge Diagnoses: Term Pregnancy-delivered  Discharge Information: Date: 06/12/2013 Activity: pelvic rest Diet: routine Medications: Ibuprofen and Percocet Condition: improved Instructions: refer to practice specific booklet Discharge to: home Follow-up Information   Follow up with Oliver PilaICHARDSON,Zaidin Blyden W, MD. Schedule an appointment as soon as possible for a visit in 6 weeks. (postpartum and Dpeo)    Specialty:  Obstetrics and Gynecology   Contact information:   [address], [address] [place] [address] [phone]       Newborn Data: Live born female  Birth Weight: 8 lb 1.5 oz (3670 g) APGAR: 8, 9  Home with mother.  Oliver PilaRICHARDSON,Treyshon Buchanon W 06/12/2013, 8:58 AM

## 2013-06-12 NOTE — Progress Notes (Signed)
Post Partum Day 1 Subjective: no complaints and tolerating PO  Requests early d/c  Objective: Blood pressure 133/81, pulse 76, temperature 97.6 F (36.4 C), temperature source Oral, resp. rate 20, height 5\' 3"  (1.6 m), weight 70.308 kg (155 lb), last menstrual period 09/10/2012, SpO2 98.00%, unknown if currently breastfeeding.  Physical Exam:  General: alert and cooperative Lochia: appropriate Uterine Fundus: firm    Recent Labs  06/11/13 1351 06/12/13 0600  HGB 9.9* 9.1*  HCT 28.5* 25.9*    Assessment/Plan: Discharge home Plans Essure pp   LOS: 1 day   Kim Franklin W 06/12/2013, 8:57 AM

## 2013-06-12 NOTE — Lactation Note (Signed)
This note was copied from the chart of Kim Franklin Moening. Lactation Consultation Note Follow up consult:  Baby Kim 5321 hours old.  P3. Mother's plan is to formula feed, pump and bottle feed.  Mother has DEBP in room but has not pumped yet.  Reviewed supply and demand, engorgement care and lactation support services.  Encouraged mother to call for further assistance.   Patient Name: Kim Franklin Howson [id number]'s Date: 06/12/2013 Reason for consult: Follow-up assessment   Maternal Data    Feeding Feeding Type: Bottle Fed - Formula Nipple Type: Regular  LATCH Score/Interventions                      Lactation Tools Discussed/Used     Consult Status Consult Status: Complete    Hardie PulleyBerkelhammer, Nitara Szczerba Boschen 06/12/2013, 10:18 AM

## 2013-06-12 NOTE — Progress Notes (Signed)
UR chart review completed.  

## 2013-06-13 LAB — RH IG WORKUP (INCLUDES ABO/RH)
ABO/RH(D): A NEG
Antibody Screen: POSITIVE
DAT, IgG: NEGATIVE
FETAL SCREEN: NEGATIVE
Gestational Age(Wks): 39.1
UNIT DIVISION: 0

## 2013-06-17 ENCOUNTER — Inpatient Hospital Stay (HOSPITAL_COMMUNITY): Admission: AD | Admit: 2013-06-17 | Payer: Self-pay | Source: Ambulatory Visit | Admitting: Obstetrics and Gynecology

## 2013-12-30 ENCOUNTER — Other Ambulatory Visit (HOSPITAL_COMMUNITY): Payer: Self-pay | Admitting: Obstetrics and Gynecology

## 2013-12-30 DIAGNOSIS — Z308 Encounter for other contraceptive management: Secondary | ICD-10-CM

## 2014-01-06 ENCOUNTER — Ambulatory Visit (HOSPITAL_COMMUNITY): Admission: RE | Admit: 2014-01-06 | Payer: Medicaid Other | Source: Ambulatory Visit

## 2014-03-08 ENCOUNTER — Encounter (HOSPITAL_COMMUNITY): Payer: Self-pay

## 2016-03-23 ENCOUNTER — Ambulatory Visit (HOSPITAL_COMMUNITY)
Admission: EM | Admit: 2016-03-23 | Discharge: 2016-03-23 | Disposition: A | Discharge status: Home / Self Care | Payer: Medicaid Other | Attending: Family Medicine | Admitting: Family Medicine

## 2016-03-23 ENCOUNTER — Encounter (HOSPITAL_COMMUNITY): Payer: Self-pay | Admitting: *Deleted

## 2016-03-23 DIAGNOSIS — J0100 Acute maxillary sinusitis, unspecified: Secondary | ICD-10-CM

## 2016-03-23 MED ORDER — AMOXICILLIN 875 MG PO TABS: 875.0000 mg | ORAL_TABLET | Freq: Two times a day (BID) | ORAL | 0 refills | Status: DC

## 2016-03-23 NOTE — ED Triage Notes (Signed)
Pt  Reports   Symptoms  Of  Sinus   Congestion   /  Cough      X  sev  Weeks   Pt   Daughter  Has  Had  Fever /  Cough    Pt  Appears  In no acute/  Severe  Distress   At this  Time

## 2016-03-23 NOTE — ED Provider Notes (Signed)
[facility]    CSN: [id number] Arrival date & time: 03/23/16  1851     History   Chief Complaint Chief Complaint  Patient presents with  . URI    HPI Kim Franklin is a 30 y.o. female.   Is a 30 year old mother of 2 children who presents with 2 weeks of sinus congestion and occasional cough.      Past Medical History:  Diagnosis Date  . Anemia   . Eczema   . Headache(784.0)   . Hypertension     Patient Active Problem List   Diagnosis Date Noted  . PIH (pregnancy induced hypertension) 06/11/2013  . Vacuum extractor delivery, delivered 06/11/2013  . THYROID STIMULATING HORMONE, ABNORMAL 02/16/2010  . HYPERTENSION 02/14/2010    Past Surgical History:  Procedure Laterality Date  . NO PAST SURGERIES    . TOOTH EXTRACTION      OB History    Gravida Para Term Preterm AB Living   3 3 3     3    SAB TAB Ectopic Multiple Live Births           3       Home Medications    Prior to Admission medications   Medication Sig Start Date End Date Taking? Authorizing Provider  amoxicillin (AMOXIL) 875 MG tablet Take 1 tablet (875 mg total) by mouth 2 (two) times daily. 03/23/16   Elvina SidleKurt Maymie Brunke, MD  ibuprofen (ADVIL,MOTRIN) 600 MG tablet Take 1 tablet (600 mg total) by mouth every 6 (six) hours. 06/12/13   Huel CoteKathy Richardson, MD  Prenatal Vit-Fe Fumarate-FA (PRENATAL MULTIVITAMIN) TABS Take 1 tablet by mouth daily at 12 noon. 11/14/12   Francee PiccoloJennifer Piepenbrink, PA-C    Family History Family History  Problem Relation Age of Onset  . Diabetes Father   . Thyroid disease Mother     Social History Social History  Substance Use Topics  . Smoking status: Former Smoker    Quit date: 06/08/2012  . Smokeless tobacco: Never Used  . Alcohol use Yes     Comment: socially     Allergies   Coconut oil   Review of Systems Review of Systems  Constitutional: Negative.  Negative for fatigue.  HENT: Positive for congestion, ear pain and sinus pressure.   Eyes:  Negative.   Respiratory: Positive for cough.   Cardiovascular: Negative.   Gastrointestinal: Negative.   Genitourinary: Negative.   Musculoskeletal: Negative.   Neurological: Negative.      Physical Exam Triage Vital Signs ED Triage Vitals  Enc Vitals Group     BP      Pulse      Resp      Temp      Temp src      SpO2      Weight      Height      Head Circumference      Peak Flow      Pain Score      Pain Loc      Pain Edu?      Excl. in GC?    No data found.   Updated Vital Signs LMP 03/16/2016      Physical Exam  Constitutional: She is oriented to person, place, and time. She appears well-developed and well-nourished.  HENT:  Head: Normocephalic.  Right Ear: External ear normal.  Left Ear: External ear normal.  Mouth/Throat: Oropharynx is clear and moist.  Eyes: Conjunctivae and EOM are normal. Pupils are equal, round, and reactive to  light.  Neck: Normal range of motion. Neck supple.  Cardiovascular: Normal rate, regular rhythm and normal heart sounds.   Pulmonary/Chest: Effort normal and breath sounds normal.  Musculoskeletal: Normal range of motion.  Neurological: She is alert and oriented to person, place, and time. No cranial nerve deficit.  Skin: Skin is warm and dry.  Nursing note and vitals reviewed.    UC Treatments / Results  Labs (all labs ordered are listed, but only abnormal results are displayed) Labs Reviewed - No data to display  EKG  EKG Interpretation None       Radiology No results found.  Procedures Procedures (including critical care time)  Medications Ordered in UC Medications - No data to display   Initial Impression / Assessment and Plan / UC Course  I have reviewed the triage vital signs and the nursing notes.  Pertinent labs & imaging results that were available during my care of the patient were reviewed by me and considered in my medical decision making (see chart for details).  Clinical Course      Final Clinical Impressions(s) / UC Diagnoses   Final diagnoses:  Acute maxillary sinusitis, recurrence not specified    New Prescriptions New Prescriptions   AMOXICILLIN (AMOXIL) 875 MG TABLET    Take 1 tablet (875 mg total) by mouth 2 (two) times daily.     Elvina SidleKurt Darolyn Double, MD 03/23/16 [phone]

## 2017-02-25 ENCOUNTER — Emergency Department (HOSPITAL_COMMUNITY)
Admission: EM | Admit: 2017-02-25 | Discharge: 2017-02-25 | Disposition: A | Discharge status: Home / Self Care | Payer: Self-pay | Attending: Emergency Medicine | Admitting: Emergency Medicine

## 2017-02-25 ENCOUNTER — Encounter (HOSPITAL_COMMUNITY): Payer: Self-pay | Admitting: *Deleted

## 2017-02-25 DIAGNOSIS — R0789 Other chest pain: Secondary | ICD-10-CM | POA: Insufficient documentation

## 2017-02-25 DIAGNOSIS — Z87891 Personal history of nicotine dependence: Secondary | ICD-10-CM | POA: Insufficient documentation

## 2017-02-25 DIAGNOSIS — R040 Epistaxis: Secondary | ICD-10-CM | POA: Insufficient documentation

## 2017-02-25 DIAGNOSIS — I10 Essential (primary) hypertension: Secondary | ICD-10-CM | POA: Insufficient documentation

## 2017-02-25 MED ORDER — IPRATROPIUM-ALBUTEROL 0.5-2.5 (3) MG/3ML IN SOLN
3.0000 mL | Freq: Once | RESPIRATORY_TRACT | Status: AC
  Administered 2017-02-25: 3 mL via RESPIRATORY_TRACT
  Filled 2017-02-25: qty 3

## 2017-02-25 MED ORDER — OXYMETAZOLINE HCL 0.05 % NA SOLN
1.0000 | Freq: Once | NASAL | Status: AC
  Administered 2017-02-25: 1 via NASAL
  Filled 2017-02-25: qty 15

## 2017-02-25 NOTE — ED Provider Notes (Signed)
[facility]-EMERGENCY DEPT Provider Note   CSN: [id number] Arrival date & time: 02/25/17  1414     History   Chief Complaint Chief Complaint  Patient presents with  . Epistaxis    HPI Kim Franklin is a 31 y.o. female with h/o allergic rhinitis presents to ED for evaluation of recurrent nose bleeds on right x 5 times today. Reports nasal congestion and rhinorrhea. Also reports central chest pressure as if someone was pushing down on her chest only when breathing, onset today, this is worse when she sits down. Alleviated by walking around and "not thinking about it". Denies exertional or pleuritic CP,cough, fever, light-headedness. She has not taken her allergy medication in the last few days. No h/o DVT/PE, prolonged travel or recent estrogen use.   HPI  Past Medical History:  Diagnosis Date  . Anemia   . Eczema   . Headache(784.0)   . Hypertension     Patient Active Problem List   Diagnosis Date Noted  . PIH (pregnancy induced hypertension) 06/11/2013  . Vacuum extractor delivery, delivered 06/11/2013  . THYROID STIMULATING HORMONE, ABNORMAL 02/16/2010  . HYPERTENSION 02/14/2010    Past Surgical History:  Procedure Laterality Date  . NO PAST SURGERIES    . TOOTH EXTRACTION      OB History    Gravida Para Term Preterm AB Living   3 3 3     3    SAB TAB Ectopic Multiple Live Births           3       Home Medications    Prior to Admission medications   Medication Sig Start Date End Date Taking? Authorizing Provider  amoxicillin (AMOXIL) 875 MG tablet Take 1 tablet (875 mg total) by mouth 2 (two) times daily. 03/23/16   Elvina SidleLauenstein, Kurt, MD  ibuprofen (ADVIL,MOTRIN) 600 MG tablet Take 1 tablet (600 mg total) by mouth every 6 (six) hours. 06/12/13   Huel Coteichardson, Kathy, MD  Prenatal Vit-Fe Fumarate-FA (PRENATAL MULTIVITAMIN) TABS Take 1 tablet by mouth daily at 12 noon. 11/14/12   Piepenbrink, Victorino DikeJennifer, PA-C    Family History Family History    Problem Relation Age of Onset  . Diabetes Father   . Thyroid disease Mother     Social History Social History  Substance Use Topics  . Smoking status: Former Smoker    Quit date: 06/08/2012  . Smokeless tobacco: Never Used  . Alcohol use Yes     Comment: socially     Allergies   Coconut oil   Review of Systems Review of Systems  Constitutional: Negative for chills and fever.  HENT: Positive for nosebleeds, postnasal drip, rhinorrhea and sneezing. Negative for ear discharge, ear pain, sinus pain, sinus pressure and sore throat.   Respiratory: Positive for chest tightness. Negative for shortness of breath.   Cardiovascular: Positive for chest pain (pressure).  Gastrointestinal: Negative for abdominal pain.     Physical Exam Updated Vital Signs BP (!) 155/102 (BP Location: Right Arm)   Pulse 86   Temp (!) 97.4 F (36.3 C) (Oral)   Resp 18   Ht 5\' 3"  (1.6 m)   Wt 61.7 kg (136 lb)   LMP 02/06/2017   SpO2 100%   BMI 24.09 kg/m   Physical Exam  Constitutional: She is oriented to person, place, and time. She appears well-developed and well-nourished. No distress.  NAD.  HENT:  Head: Normocephalic and atraumatic.  Right Ear: External ear normal.  Left Ear: External ear normal.  Bilateral mucosal edema and erythema R>L Right anterior mucosa of septum is friable and has 2 tiny cuts that are slowly bleeding.  Oropharynx and tonsils normal, no blood  Eyes: Conjunctivae and EOM are normal. No scleral icterus.  Neck: Normal range of motion. Neck supple.  Cardiovascular: Normal rate, regular rhythm and normal heart sounds.   No murmur heard. Pulmonary/Chest: Effort normal and breath sounds normal. She has no wheezes. She exhibits no tenderness.  Abdominal: Soft. There is no tenderness.  Musculoskeletal: Normal range of motion. She exhibits no deformity.  Neurological: She is alert and oriented to person, place, and time.  Skin: Skin is warm and dry. Capillary refill  takes less than 2 seconds.  Psychiatric: She has a normal mood and affect. Her behavior is normal. Judgment and thought content normal.  Nursing note and vitals reviewed.    ED Treatments / Results  Labs (all labs ordered are listed, but only abnormal results are displayed) Labs Reviewed - No data to display  EKG  EKG Interpretation None       Radiology No results found.  Procedures Procedures (including critical care time)  Medications Ordered in ED Medications  ipratropium-albuterol (DUONEB) 0.5-2.5 (3) MG/3ML nebulizer solution 3 mL (3 mLs Nebulization Given 02/25/17 1500)  oxymetazoline (AFRIN) 0.05 % nasal spray 1 spray (1 spray Each Nare Given 02/25/17 1500)     Initial Impression / Assessment and Plan / ED Course  I have reviewed the triage vital signs and the nursing notes.  Pertinent labs & imaging results that were available during my care of the patient were reviewed by me and considered in my medical decision making (see chart for details).    31 yo female presents with right sided anterior epistaxis and h/o allergic rhinitis. Source of bleeding on right anterior mucosa of septum. She has h/o allergic rhinitis and has not been compliant with anti-histamines in the last few days. Reports worsening rhinorrhea, congestion, cough, chest tightness/pressure. Afrin and pressure applied in ED with resolution of epistaxis. No blood in posterior oropharynx. Duoneb completely resolved chest tightness. Chest tightness doesnot sound like ACS, PNA or PE. PERC negative. Encouraged compliance with allergy medications, keep nares moist. Provided education on how to control nose bleeds. She is agreeable with ED tx and discharge plan.   Final Clinical Impressions(s) / ED Diagnoses   Final diagnoses:  Right-sided epistaxis    New Prescriptions New Prescriptions   No medications on file     Liberty Handy, PA-C 02/25/17 1551    Samuel Jester, DO 03/01/17 [phone]

## 2017-02-25 NOTE — ED Notes (Signed)
Pt called out from room stating that her nose has began to bleed again, RN applied pressure to nose and instructed pt to tilt head forward, Pt reports that she is not sure if nebulizer treatment trigger bleeding. MD made aware.

## 2017-02-25 NOTE — Discharge Instructions (Signed)
Take allergy medication + benadryl for the next 24-48 hours. Avoid touching, rubbing or picking nose. You need a strong blood clot to form over both tiny cuts inside your nose.   If bleeding reoccurs, hold pressure over tip of nose for at least 20-30 min.   Return if chest pressure becomes exertional, you develop fevers, chills, cough, shortness of breath

## 2017-02-25 NOTE — ED Triage Notes (Signed)
Patient is alert and oriented x4.  She is being seen for an intermittent bloody nose.  Patient states that she went to work this morning and has had bleeding that would stop and go.  Patient denies any lightheaded or dizziness. Patient adds that she has nasal congestion and chest tightness.

## 2018-01-16 ENCOUNTER — Encounter (HOSPITAL_COMMUNITY): Payer: Self-pay | Admitting: Emergency Medicine

## 2018-01-16 ENCOUNTER — Ambulatory Visit (HOSPITAL_COMMUNITY)
Admission: EM | Admit: 2018-01-16 | Discharge: 2018-01-16 | Disposition: A | Discharge status: Home / Self Care | Payer: Self-pay | Attending: Family Medicine | Admitting: Family Medicine

## 2018-01-16 DIAGNOSIS — G43009 Migraine without aura, not intractable, without status migrainosus: Secondary | ICD-10-CM

## 2018-01-16 MED ORDER — IBUPROFEN 800 MG PO TABS: 800.0000 mg | ORAL_TABLET | Freq: Three times a day (TID) | ORAL | 0 refills | Status: AC

## 2018-01-16 NOTE — ED Provider Notes (Signed)
[facility]    CSN: [id number] Arrival date & time: 01/16/18  1310     History   Chief Complaint Chief Complaint  Patient presents with  . Headache    HPI Kim Franklin is a 32 y.o. female.   Kim Franklin presents with complaints of migraine headache which started yesterday. States she had to leave work due to the severity of the headache. Worse to left head. Light, sound and movement make it worse. Feels similar to other migraines she has had in the past. She follows with neurology. States she has been provided with sumatriptan but this causes too much drowsiness for her to take at work. She took Excedrin yesterday which did help some. Pain persists today but is not as severe as yesterday. Nagging in sensation. No vision changes. Yesterday with nausea, none today. No injury. Pain 7/10 in severity.    ROS per HPI.      Past Medical History:  Diagnosis Date  . Anemia   . Eczema   . Headache(784.0)   . Hypertension     Patient Active Problem List   Diagnosis Date Noted  . PIH (pregnancy induced hypertension) 06/11/2013  . Vacuum extractor delivery, delivered 06/11/2013  . THYROID STIMULATING HORMONE, ABNORMAL 02/16/2010  . HYPERTENSION 02/14/2010    Past Surgical History:  Procedure Laterality Date  . NO PAST SURGERIES    . TOOTH EXTRACTION      OB History    Gravida  3   Para  3   Term  3   Preterm      AB      Living  3     SAB      TAB      Ectopic      Multiple      Live Births  3            Home Medications    Prior to Admission medications   Medication Sig Start Date End Date Taking? Authorizing Provider  ibuprofen (ADVIL,MOTRIN) 800 MG tablet Take 1 tablet (800 mg total) by mouth 3 (three) times daily. 01/16/18   Georgetta HaberBurky, Arleigh Dicola B, NP  Prenatal Vit-Fe Fumarate-FA (PRENATAL MULTIVITAMIN) TABS Take 1 tablet by mouth daily at 12 noon. 11/14/12   Piepenbrink, Victorino DikeJennifer, PA-C    Family History Family History  Problem Relation  Age of Onset  . Diabetes Father   . Thyroid disease Mother     Social History Social History   Tobacco Use  . Smoking status: Former Smoker    Last attempt to quit: 06/08/2012    Years since quitting: 5.6  . Smokeless tobacco: Never Used  Substance Use Topics  . Alcohol use: Yes    Comment: socially  . Drug use: No     Allergies   Coconut oil   Review of Systems Review of Systems   Physical Exam Triage Vital Signs ED Triage Vitals  Enc Vitals Group     BP 01/16/18 1325 (!) 148/97     Pulse Rate 01/16/18 1325 91     Resp 01/16/18 1325 16     Temp 01/16/18 1325 98.1 F (36.7 C)     Temp Source 01/16/18 1325 Oral     SpO2 01/16/18 1325 100 %     Weight --      Height --      Head Circumference --      Peak Flow --      Pain Score 01/16/18 1340 7  Pain Loc --      Pain Edu? --      Excl. in GC? --    No data found.  Updated Vital Signs BP (!) 148/97 (BP Location: Right Arm)   Pulse 91   Temp 98.1 F (36.7 C) (Oral)   Resp 16   SpO2 100%    Physical Exam  Constitutional: She is oriented to person, place, and time. She appears well-developed and well-nourished. No distress.  HENT:  Head: Normocephalic and atraumatic.  Mouth/Throat: Oropharynx is clear and moist.  Eyes: Pupils are equal, round, and reactive to light. EOM are normal.  Neck: Normal range of motion.  Cardiovascular: Normal rate, regular rhythm and normal heart sounds.  Pulmonary/Chest: Effort normal and breath sounds normal.  Neurological: She is alert and oriented to person, place, and time. She has normal strength. She is not disoriented. No cranial nerve deficit or sensory deficit. GCS eye subscore is 4. GCS verbal subscore is 5. GCS motor subscore is 6.  Skin: Skin is warm and dry.  Psychiatric: She has a normal mood and affect.     UC Treatments / Results  Labs (all labs ordered are listed, but only abnormal results are displayed) Labs Reviewed - No data to  display  EKG None  Radiology No results found.  Procedures Procedures (including critical care time)  Medications Ordered in UC Medications - No data to display  Initial Impression / Assessment and Plan / UC Course  I have reviewed the triage vital signs and the nursing notes.  Pertinent labs & imaging results that were available during my care of the patient were reviewed by me and considered in my medical decision making (see chart for details).     Non toxic in appearance. No acute neurological findings. Consistent with previous migraines she has had. Declines toradol injection here today. States she would like to take oral medication at home. Discussed excedrine and ibuprofen, encouraged sleep, benadryl would likely be helpful. Continue to follow up with neurology as needed. Return precautions provided. Patient verbalized understanding and agreeable to plan.  Ambulatory out of clinic without difficulty.    Final Clinical Impressions(s) / UC Diagnoses   Final diagnoses:  Migraine without aura and without status migrainosus, not intractable     Discharge Instructions     You may repeat Excedrin and ibuprofen this afternoon.  Drink plenty of fluids to ensure adequate hydration.  Benadryl today once home may also be helpful with headache and to promote rest.  Rest in a quiet dark room as able.  Continue to follow up with neurology as needed for recurrent migraines.      ED Prescriptions    Medication Sig Dispense Auth. Provider   ibuprofen (ADVIL,MOTRIN) 800 MG tablet Take 1 tablet (800 mg total) by mouth 3 (three) times daily. 21 tablet Georgetta Haber, NP     Controlled Substance Prescriptions [place] Controlled Substance Registry consulted? Not Applicable   Georgetta Haber, NP 01/16/18 1417

## 2018-01-16 NOTE — ED Triage Notes (Signed)
Pt here for HA x 2 days

## 2018-01-16 NOTE — Discharge Instructions (Signed)
You may repeat Excedrin and ibuprofen this afternoon.  Drink plenty of fluids to ensure adequate hydration.  Benadryl today once home may also be helpful with headache and to promote rest.  Rest in a quiet dark room as able.  Continue to follow up with neurology as needed for recurrent migraines.

## 2018-01-31 ENCOUNTER — Encounter (HOSPITAL_COMMUNITY): Payer: Self-pay

## 2018-01-31 ENCOUNTER — Ambulatory Visit (HOSPITAL_COMMUNITY)
Admission: EM | Admit: 2018-01-31 | Discharge: 2018-01-31 | Disposition: A | Discharge status: Home / Self Care | Payer: Self-pay | Attending: Family Medicine | Admitting: Family Medicine

## 2018-01-31 DIAGNOSIS — J069 Acute upper respiratory infection, unspecified: Secondary | ICD-10-CM

## 2018-01-31 DIAGNOSIS — B9789 Other viral agents as the cause of diseases classified elsewhere: Secondary | ICD-10-CM

## 2018-01-31 MED ORDER — FLUTICASONE PROPIONATE 50 MCG/ACT NA SUSP: 2.0000 | Freq: Every day | NASAL | 0 refills | Status: AC

## 2018-01-31 MED ORDER — CETIRIZINE-PSEUDOEPHEDRINE ER 5-120 MG PO TB12: 1.0000 | ORAL_TABLET | Freq: Every day | ORAL | 0 refills | Status: AC

## 2018-01-31 NOTE — Discharge Instructions (Addendum)
Get plenty of rest and push fluids Zyrtec-D prescribed take as directed. Do not take if breastfeeding Flonase prescribed.  Take as directed.   Continue with OTC medication as needed for symptomatic relief Follow up with PCP or [facility] if symptoms persist Return or go to ER if you have any new or worsening symptoms such as fever, chills, nausea, vomiting, difficulty breathing, difficulty swallowing, chest pain, shortness of breath, abdominal pain, worsening cough, etc..Marland Kitchen

## 2018-01-31 NOTE — ED Provider Notes (Signed)
[facility]   [id number] 01/31/18 Arrival Time: 1522   CC: URI symptoms and cough  SUBJECTIVE: History from: patient.  Kim Franklin is a 32 y.o. female who presents with abrupt onset of nasal congestion, sinus pressure, drainage, persistent dry cough, sore throat, and ear pain x 3 days ago.  Admits to sick exposure to kids.  Has tried alka seltzer maximum daytime cold and flu, theraflu, and tea with temporary relief.  Symptoms are made worse with laying down at night.  Reports previous symptoms in the past and diagnosed with sinus infection.  Denies fever, chills, fatigue, SOB, wheezing, chest pain, nausea, changes in bowel or bladder habits.    Received flu shot this year: no.  ROS: As per HPI.  Past Medical History:  Diagnosis Date  . Anemia   . Eczema   . Headache(784.0)   . Hypertension    Past Surgical History:  Procedure Laterality Date  . NO PAST SURGERIES    . TOOTH EXTRACTION     Allergies  Allergen Reactions  . Coconut Oil Anaphylaxis   No current facility-administered medications on file prior to encounter.    Current Outpatient Medications on File Prior to Encounter  Medication Sig Dispense Refill  . ibuprofen (ADVIL,MOTRIN) 800 MG tablet Take 1 tablet (800 mg total) by mouth 3 (three) times daily. 21 tablet 0  . Prenatal Vit-Fe Fumarate-FA (PRENATAL MULTIVITAMIN) TABS Take 1 tablet by mouth daily at 12 noon. 30 tablet 0   Social History   Socioeconomic History  . Marital status: Single    Spouse name: Not on file  . Number of children: Not on file  . Years of education: Not on file  . Highest education level: Not on file  Occupational History  . Not on file  Social Needs  . Financial resource strain: Not on file  . Food insecurity:    Worry: Not on file    Inability: Not on file  . Transportation needs:    Medical: Not on file    Non-medical: Not on file  Tobacco Use  . Smoking status: Former Smoker    Last attempt to quit: 06/08/2012     Years since quitting: 5.6  . Smokeless tobacco: Never Used  Substance and Sexual Activity  . Alcohol use: Yes    Comment: socially  . Drug use: No  . Sexual activity: Yes    Birth control/protection: None  Lifestyle  . Physical activity:    Days per week: Not on file    Minutes per session: Not on file  . Stress: Not on file  Relationships  . Social connections:    Talks on phone: Not on file    Gets together: Not on file    Attends religious service: Not on file    Active member of club or organization: Not on file    Attends meetings of clubs or organizations: Not on file    Relationship status: Not on file  . Intimate partner violence:    Fear of current or ex partner: Not on file    Emotionally abused: Not on file    Physically abused: Not on file    Forced sexual activity: Not on file  Other Topics Concern  . Not on file  Social History Narrative  . Not on file   Family History  Problem Relation Age of Onset  . Diabetes Father   . Thyroid disease Mother     OBJECTIVE:  Vitals:   01/31/18 1612  BP: (!) 153/95  Pulse: 99  Temp: 97.9 F (36.6 C)  TempSrc: Temporal  SpO2: 100%     General appearance: alert; appears fatigued; nontoxic  HEENT: Ears: EACs clear, TMs pearly gray with visible cone of light, without erythema; Eyes: PERRL, EOMI grossly; Nose: nares erythematous, with clear rhinorrhea; Throat: oropharynx clear, tonsils not enlarged or erythematous, uvula midline Neck: supple without LAD Lungs: unlabored respirations, symmetrical air entry; cough: absent; no respiratory distress; CTAB Heart: regular rate and rhythm.  Radial pulses 2+ symmetrical bilaterally Skin: warm and dry Psychological: alert and cooperative; normal mood and affect   ASSESSMENT & PLAN:  1. Viral URI with cough     Meds ordered this encounter  Medications  . cetirizine-pseudoephedrine (ZYRTEC-D) 5-120 MG tablet    Sig: Take 1 tablet by mouth daily.    Dispense:  30  tablet    Refill:  0    Order Specific Question:   Supervising Provider    Answer:   Isa Rankin [phone]  . fluticasone (FLONASE) 50 MCG/ACT nasal spray    Sig: Place 2 sprays into both nostrils daily.    Dispense:  16 g    Refill:  0    Order Specific Question:   Supervising Provider    Answer:   Isa Rankin [[id number]]    Get plenty of rest and push fluids Zyrtec-D prescribed take as directed Flonase prescribed.  Take as directed.   Continue with OTC medication as needed for symptomatic relief Follow up with PCP or [facility] if symptoms persist Return or go to ER if you have any new or worsening symptoms such as fever, chills, nausea, vomiting, difficulty breathing, difficulty swallowing, chest pain, shortness of breath, abdominal pain, worsening cough, etc...  Reviewed expectations re: course of current medical issues. Questions answered. Outlined signs and symptoms indicating need for more acute intervention. Patient verbalized understanding. After Visit Summary given.         Rennis Harding, PA-C 01/31/18 1658    Isa Rankin, MD 02/11/18 [phone]

## 2018-01-31 NOTE — ED Triage Notes (Signed)
Pt presents with upper respiratory issues; congestion, nasal drainage, persistent cough , sore throat and ear pain in both ears.

## 2018-02-03 ENCOUNTER — Emergency Department (HOSPITAL_COMMUNITY)
Admission: EM | Admit: 2018-02-03 | Discharge: 2018-02-03 | Disposition: A | Discharge status: Home / Self Care | Payer: Self-pay | Attending: Emergency Medicine | Admitting: Emergency Medicine

## 2018-02-03 ENCOUNTER — Encounter (HOSPITAL_COMMUNITY): Payer: Self-pay | Admitting: Emergency Medicine

## 2018-02-03 DIAGNOSIS — H1033 Unspecified acute conjunctivitis, bilateral: Secondary | ICD-10-CM | POA: Insufficient documentation

## 2018-02-03 DIAGNOSIS — Z87891 Personal history of nicotine dependence: Secondary | ICD-10-CM | POA: Insufficient documentation

## 2018-02-03 DIAGNOSIS — I10 Essential (primary) hypertension: Secondary | ICD-10-CM | POA: Insufficient documentation

## 2018-02-03 DIAGNOSIS — Z79899 Other long term (current) drug therapy: Secondary | ICD-10-CM | POA: Insufficient documentation

## 2018-02-03 MED ORDER — AMOXICILLIN 500 MG PO CAPS: 500.0000 mg | ORAL_CAPSULE | Freq: Three times a day (TID) | ORAL | 0 refills | Status: AC

## 2018-02-03 MED ORDER — TOBRAMYCIN 0.3 % OP SOLN: 1.0000 [drp] | OPHTHALMIC | 0 refills | Status: AC

## 2018-02-03 NOTE — ED Provider Notes (Signed)
[facility]EMERGENCY [facility]PT Provider Note   CSN: [id number] Arrival date & time: 02/03/18  1516     History   Chief Complaint Chief Complaint  Patient presents with  . Eye Drainage    HPI Kim Franklin is a 32 y.o. female.  The history is provided by the patient. No language interpreter was used.  Conjunctivitis  This is a new problem. The current episode started more than 2 days ago. The problem occurs constantly. The problem has been gradually worsening. Nothing aggravates the symptoms. Nothing relieves the symptoms. She has tried nothing for the symptoms. The treatment provided no relief.  Pt complains of a cough and sinus pressure.     Past Medical History:  Diagnosis Date  . Anemia   . Eczema   . Headache(784.0)   . Hypertension     Patient Active Problem List   Diagnosis Date Noted  . PIH (pregnancy induced hypertension) 06/11/2013  . Vacuum extractor delivery, delivered 06/11/2013  . THYROID STIMULATING HORMONE, ABNORMAL 02/16/2010  . HYPERTENSION 02/14/2010    Past Surgical History:  Procedure Laterality Date  . NO PAST SURGERIES    . TOOTH EXTRACTION       OB History    Gravida  3   Para  3   Term  3   Preterm      AB      Living  3     SAB      TAB      Ectopic      Multiple      Live Births  3            Home Medications    Prior to Admission medications   Medication Sig Start Date End Date Taking? Authorizing Provider  amoxicillin (AMOXIL) 500 MG capsule Take 1 capsule (500 mg total) by mouth 3 (three) times daily. 02/03/18   Elson Areas, PA-C  cetirizine-pseudoephedrine (ZYRTEC-D) 5-120 MG tablet Take 1 tablet by mouth daily. 01/31/18   Wurst, [place], PA-C  fluticasone (FLONASE) 50 MCG/ACT nasal spray Place 2 sprays into both nostrils daily. 01/31/18   Wurst, [place], PA-C  ibuprofen (ADVIL,MOTRIN) 800 MG tablet Take 1 tablet (800 mg total) by mouth 3 (three) times daily. 01/16/18   Georgetta Haber,  NP  Prenatal Vit-Fe Fumarate-FA (PRENATAL MULTIVITAMIN) TABS Take 1 tablet by mouth daily at 12 noon. 11/14/12   Piepenbrink, Victorino Dike, PA-C  tobramycin (TOBREX) 0.3 % ophthalmic solution Place 1 drop into both eyes every 4 (four) hours. 02/03/18   Elson Areas, PA-C    Family History Family History  Problem Relation Age of Onset  . Diabetes Father   . Thyroid disease Mother     Social History Social History   Tobacco Use  . Smoking status: Former Smoker    Last attempt to quit: 06/08/2012    Years since quitting: 5.6  . Smokeless tobacco: Never Used  Substance Use Topics  . Alcohol use: Yes    Comment: socially  . Drug use: No     Allergies   Coconut oil   Review of Systems Review of Systems  All other systems reviewed and are negative.    Physical Exam Updated Vital Signs BP (!) 142/108 (BP Location: Left Arm)   Pulse 100   Temp 98.4 F (36.9 C) (Oral)   Resp 20   LMP 02/02/2018   SpO2 99%   Physical Exam  Constitutional: She appears well-developed and well-nourished.  HENT:  Head:  Normocephalic.  Tender maxillary sinuses   Eyes: Right eye exhibits discharge. Left eye exhibits discharge.  Injected bilat   Neck: Normal range of motion.  Cardiovascular: Normal rate.  Pulmonary/Chest: Effort normal.  Neurological: She is alert.  Skin: Skin is warm.  Psychiatric: She has a normal mood and affect.  Nursing note and vitals reviewed.    ED Treatments / Results  Labs (all labs ordered are listed, but only abnormal results are displayed) Labs Reviewed - No data to display  EKG None  Radiology No results found.  Procedures Procedures (including critical care time)  Medications Ordered in ED Medications - No data to display   Initial Impression / Assessment and Plan / ED Course  I have reviewed the triage vital signs and the nursing notes.  Pertinent labs & imaging results that were available during my care of the patient were reviewed by me  and considered in my medical decision making (see chart for details).       Final Clinical Impressions(s) / ED Diagnoses   Final diagnoses:  Acute conjunctivitis of both eyes, unspecified acute conjunctivitis type    ED Discharge Orders         Ordered    tobramycin (TOBREX) 0.3 % ophthalmic solution  Every 4 hours     02/03/18 1718    amoxicillin (AMOXIL) 500 MG capsule  3 times daily     02/03/18 1718        An After Visit Summary was printed and given to the patient.    Elson Areas, [place] 02/03/18 2233    Tegeler, Canary Brim, MD 02/03/18 2312

## 2018-02-03 NOTE — ED Triage Notes (Signed)
Pt c/o of bilat eye drainage and discharge this morning when woke up. Denies pain or itching.  Pt reports she was seen Friday she was seen for URI at Urgent are and told to take OTC meds.

## 2018-06-17 ENCOUNTER — Encounter (HOSPITAL_COMMUNITY): Payer: Self-pay

## 2018-06-17 DIAGNOSIS — Z5321 Procedure and treatment not carried out due to patient leaving prior to being seen by health care provider: Secondary | ICD-10-CM | POA: Insufficient documentation

## 2018-06-17 DIAGNOSIS — R0981 Nasal congestion: Secondary | ICD-10-CM | POA: Insufficient documentation

## 2018-06-17 NOTE — ED Triage Notes (Signed)
Pt seen at urgent care on Wednesday and told to take Zyrtec, pt states she has had no relief and feels like this is a sinus infection. States her nose started bleeding today on and off. Pt states she is also congested with a sore throat.

## 2018-06-18 ENCOUNTER — Encounter (HOSPITAL_COMMUNITY): Payer: Self-pay | Admitting: Emergency Medicine

## 2018-06-18 ENCOUNTER — Emergency Department (HOSPITAL_COMMUNITY)
Admission: EM | Admit: 2018-06-18 | Discharge: 2018-06-18 | Discharge status: Against Medical Advice | Payer: Self-pay | Attending: Emergency Medicine | Admitting: Emergency Medicine

## 2018-06-18 ENCOUNTER — Emergency Department (HOSPITAL_COMMUNITY)
Admission: EM | Admit: 2018-06-18 | Discharge: 2018-06-18 | Disposition: A | Discharge status: Home / Self Care | Payer: Self-pay | Attending: Emergency Medicine | Admitting: Emergency Medicine

## 2018-06-18 DIAGNOSIS — Z79899 Other long term (current) drug therapy: Secondary | ICD-10-CM | POA: Insufficient documentation

## 2018-06-18 DIAGNOSIS — R040 Epistaxis: Secondary | ICD-10-CM | POA: Insufficient documentation

## 2018-06-18 DIAGNOSIS — J0100 Acute maxillary sinusitis, unspecified: Secondary | ICD-10-CM | POA: Insufficient documentation

## 2018-06-18 DIAGNOSIS — Z87891 Personal history of nicotine dependence: Secondary | ICD-10-CM | POA: Insufficient documentation

## 2018-06-18 DIAGNOSIS — I10 Essential (primary) hypertension: Secondary | ICD-10-CM | POA: Insufficient documentation

## 2018-06-18 MED ORDER — OXYMETAZOLINE HCL 0.05 % NA SOLN
1.0000 | Freq: Once | NASAL | Status: AC
  Administered 2018-06-18: 1 via NASAL
  Filled 2018-06-18: qty 30

## 2018-06-18 MED ORDER — AMOXICILLIN-POT CLAVULANATE 875-125 MG PO TABS: 1.0000 | ORAL_TABLET | Freq: Two times a day (BID) | ORAL | 0 refills | Status: AC

## 2018-06-18 NOTE — ED Triage Notes (Signed)
Pt reports having nose bleeds yesterday, states she sneezed this am and her nose started bleeding again. States she went to [facility] last night because she had a large clot come out of her nose but she never saw a provider due to wait times. Reports recent symptoms of sinusitis. Reports bleeding has been ongoing since 0755 this am.

## 2018-06-18 NOTE — Discharge Instructions (Addendum)
Please read attached information. If you experience any new or worsening signs or symptoms please return to the emergency room for evaluation. Please follow-up with your primary care provider or specialist as discussed. Please use medication prescribed only as directed and discontinue taking if you have any concerning signs or symptoms.   °

## 2018-06-18 NOTE — ED Provider Notes (Signed)
[facility] Provider Note   CSN: [id number] Arrival date & time: 06/18/18  [id number]     History   Chief Complaint Chief Complaint  Patient presents with  . Epistaxis    HPI Kim Franklin is a 33 y.o. female.  HPI  33 year old female presents today with complaints of epistaxis.  Patient notes approximate 1 week ago she developed rhinorrhea and congestion.  She was seen in urgent care and tested for strep and influenza both were negative.  She was discharged home with symptomatic care.  She notes yesterday she developed a nosebleed, [facility] but due to the weight could not wait.  She notes waking up and having an episode of sneezing which caused the bleed.  This stopped with direct pressure.  She again had another episode of sneezing which caused a rebleed.  Patient notes that over the last week or sinus symptoms have localized to the right maxillary region.  No other abnormal bruising or bleeding.  Past Medical History:  Diagnosis Date  . Anemia   . Eczema   . Headache(784.0)   . Hypertension     Patient Active Problem List   Diagnosis Date Noted  . PIH (pregnancy induced hypertension) 06/11/2013  . Vacuum extractor delivery, delivered 06/11/2013  . THYROID STIMULATING HORMONE, ABNORMAL 02/16/2010  . HYPERTENSION 02/14/2010    Past Surgical History:  Procedure Laterality Date  . NO PAST SURGERIES    . TOOTH EXTRACTION       OB History    Gravida  3   Para  3   Term  3   Preterm      AB      Living  3     SAB      TAB      Ectopic      Multiple      Live Births  3            Home Medications    Prior to Admission medications   Medication Sig Start Date End Date Taking? Authorizing Provider  amoxicillin (AMOXIL) 500 MG capsule Take 1 capsule (500 mg total) by mouth 3 (three) times daily. 02/03/18   Elson AreasSofia, Leslie K, PA-C  amoxicillin-clavulanate (AUGMENTIN) 875-125 MG  tablet Take 1 tablet by mouth every 12 (twelve) hours. 06/18/18   Emillie Chasen, Tinnie GensJeffrey, PA-C  cetirizine-pseudoephedrine (ZYRTEC-D) 5-120 MG tablet Take 1 tablet by mouth daily. 01/31/18   Wurst, [place], PA-C  fluticasone (FLONASE) 50 MCG/ACT nasal spray Place 2 sprays into both nostrils daily. 01/31/18   Wurst, [place]itt[place], PA-C  ibuprofen (ADVIL,MOTRIN) 800 MG tablet Take 1 tablet (800 mg total) by mouth 3 (three) times daily. 01/16/18   Georgetta HaberBurky, Natalie B, NP  Prenatal Vit-Fe Fumarate-FA (PRENATAL MULTIVITAMIN) TABS Take 1 tablet by mouth daily at 12 noon. 11/14/12   Piepenbrink, Victorino DikeJennifer, PA-C  tobramycin (TOBREX) 0.3 % ophthalmic solution Place 1 drop into both eyes every 4 (four) hours. 02/03/18   Elson AreasSofia, Leslie K, PA-C    Family History Family History  Problem Relation Age of Onset  . Diabetes Father   . Thyroid disease Mother     Social History Social History   Tobacco Use  . Smoking status: Former Smoker    Last attempt to quit: 06/08/2012    Years since quitting: 6.0  . Smokeless tobacco: Never Used  Substance Use Topics  . Alcohol use: Yes    Comment: socially  . Drug use: No  Allergies   Coconut oil   Review of Systems Review of Systems  All other systems reviewed and are negative.    Physical Exam Updated Vital Signs BP (!) 158/110 (BP Location: Right Arm)   Pulse (!) 102   Temp 98.4 F (36.9 C) (Oral)   Resp 16   LMP 05/27/2018   SpO2 100%   Physical Exam Vitals signs and nursing note reviewed.  Constitutional:      Appearance: She is well-developed.  HENT:     Head: Normocephalic and atraumatic.     Comments: Friable mucosa on the right, no active bleeding, dried blood in the nare Eyes:     General: No scleral icterus.       Right eye: No discharge.        Left eye: No discharge.     Conjunctiva/sclera: Conjunctivae normal.     Pupils: Pupils are equal, round, and reactive to light.  Neck:     Musculoskeletal: Normal range of motion.     Vascular: No  JVD.     Trachea: No tracheal deviation.  Pulmonary:     Effort: Pulmonary effort is normal.     Breath sounds: No stridor.  Neurological:     Mental Status: She is alert and oriented to person, place, and time.     Coordination: Coordination normal.  Psychiatric:        Behavior: Behavior normal.        Thought Content: Thought content normal.        Judgment: Judgment normal.     ED Treatments / Results  Labs (all labs ordered are listed, but only abnormal results are displayed) Labs Reviewed - No data to display  EKG None  Radiology No results found.  Procedures Procedures (including critical care time)  Medications Ordered in ED Medications  oxymetazoline (AFRIN) 0.05 % nasal spray 1 spray (1 spray Each Nare Given by Other 06/18/18 0959)     Initial Impression / Assessment and Plan / ED Course  I have reviewed the triage vital signs and the nursing notes.  Pertinent labs & imaging results that were available during my care of the patient were reviewed by me and considered in my medical decision making (see chart for details).       Assessment/Plan: 33 year old female presents today with epistaxis.  She had no active bleed throughout her stay here in the emergency room.  She is discharged with symptomatic care and strict return precautions.  Patient has unilateral facial pain with sinus related symptoms with likely sinusitis.  Patient will be given Augmentin if symptoms do not improve with the next 2 days she will start the antibiotics.  Patient verbalized understanding and agreement to today's plan.   Final Clinical Impressions(s) / ED Diagnoses   Final diagnoses:  Epistaxis  Acute maxillary sinusitis, recurrence not specified    ED Discharge Orders         Ordered    amoxicillin-clavulanate (AUGMENTIN) 875-125 MG tablet  Every 12 hours     06/18/18 1033           Eyvonne Mechanic, PA-C 06/18/18 1035    Jacalyn Lefevre, MD 06/18/18 1137

## 2018-06-18 NOTE — ED Notes (Signed)
Pt provided ice pack and educated on holding to bridge of nose to help with bleeding.
